# Patient Record
Sex: Female | Born: 1975
Health system: Southern US, Community
[De-identification: ages and names within clinical notes are randomized; demographics above are authoritative.]

## PROBLEM LIST (undated history)

## (undated) DIAGNOSIS — U071 COVID-19: Secondary | ICD-10-CM

## (undated) DIAGNOSIS — I10 Essential (primary) hypertension: Secondary | ICD-10-CM

## (undated) DIAGNOSIS — M1990 Unspecified osteoarthritis, unspecified site: Secondary | ICD-10-CM

## (undated) DIAGNOSIS — K219 Gastro-esophageal reflux disease without esophagitis: Secondary | ICD-10-CM

## (undated) DIAGNOSIS — Z720 Tobacco use: Secondary | ICD-10-CM

## (undated) HISTORY — DX: Unspecified osteoarthritis, unspecified site: M19.90

## (undated) HISTORY — PX: TONSILLECTOMY: SUR1361

## (undated) HISTORY — PX: TUBAL LIGATION: SHX77

---

## 2005-06-29 ENCOUNTER — Emergency Department: Payer: Self-pay | Admitting: Emergency Medicine

## 2005-12-11 ENCOUNTER — Emergency Department: Payer: Self-pay | Admitting: Internal Medicine

## 2006-10-14 ENCOUNTER — Emergency Department: Payer: Self-pay | Admitting: Emergency Medicine

## 2006-12-02 ENCOUNTER — Emergency Department: Payer: Self-pay

## 2007-09-29 ENCOUNTER — Ambulatory Visit: Payer: Self-pay | Admitting: Family Medicine

## 2007-10-07 ENCOUNTER — Ambulatory Visit: Payer: Self-pay | Admitting: Family Medicine

## 2008-01-14 ENCOUNTER — Emergency Department: Payer: Self-pay | Admitting: Emergency Medicine

## 2008-01-28 ENCOUNTER — Emergency Department: Payer: Self-pay | Admitting: Emergency Medicine

## 2008-02-08 ENCOUNTER — Emergency Department: Payer: Self-pay | Admitting: Emergency Medicine

## 2008-04-04 ENCOUNTER — Ambulatory Visit: Payer: Self-pay | Admitting: Certified Nurse Midwife

## 2008-10-16 ENCOUNTER — Emergency Department: Payer: Self-pay | Admitting: Emergency Medicine

## 2009-11-01 ENCOUNTER — Emergency Department: Payer: Self-pay | Admitting: Internal Medicine

## 2010-02-28 ENCOUNTER — Emergency Department: Payer: Self-pay | Admitting: Emergency Medicine

## 2010-09-12 ENCOUNTER — Emergency Department: Payer: Self-pay | Admitting: Internal Medicine

## 2011-05-26 ENCOUNTER — Emergency Department: Payer: Self-pay | Admitting: Emergency Medicine

## 2012-05-31 ENCOUNTER — Emergency Department: Payer: Self-pay | Admitting: Emergency Medicine

## 2012-05-31 LAB — URINALYSIS, COMPLETE
Bilirubin,UR: NEGATIVE
Glucose,UR: NEGATIVE mg/dL
Ketone: NEGATIVE
Nitrite: NEGATIVE
Ph: 5
Protein: 30
RBC,UR: 65 /HPF
Specific Gravity: 1.026
Squamous Epithelial: 20
WBC UR: 139 /HPF

## 2012-05-31 LAB — COMPREHENSIVE METABOLIC PANEL WITH GFR
Albumin: 3.5 g/dL
Alkaline Phosphatase: 60 U/L
Anion Gap: 6 — ABNORMAL LOW
BUN: 9 mg/dL
Bilirubin,Total: 0.4 mg/dL
Calcium, Total: 8.7 mg/dL
Chloride: 109 mmol/L — ABNORMAL HIGH
Co2: 26 mmol/L
Creatinine: 0.86 mg/dL
EGFR (African American): >60
EGFR (Non-African Amer.): >60
Glucose: 111 mg/dL — ABNORMAL HIGH
Osmolality: 281
Potassium: 3.7 mmol/L
SGOT(AST): 19 U/L
SGPT (ALT): 15 U/L
Sodium: 141 mmol/L
Total Protein: 7.5 g/dL

## 2012-05-31 LAB — CBC
HGB: 12.8 g/dL (ref 12.0–16.0)
MCH: 28.4 pg (ref 26.0–34.0)
MCV: 85 fL (ref 80–100)
Platelet: 308 10*3/uL (ref 150–440)
RBC: 4.52 10*6/uL (ref 3.80–5.20)
RDW: 14.8 % — ABNORMAL HIGH (ref 11.5–14.5)

## 2012-05-31 LAB — WET PREP, GENITAL

## 2012-05-31 LAB — PREGNANCY, URINE: Pregnancy Test, Urine: NEGATIVE m[IU]/mL

## 2012-06-01 LAB — URINE CULTURE

## 2012-07-21 ENCOUNTER — Emergency Department: Payer: Self-pay | Admitting: Emergency Medicine

## 2012-07-24 LAB — BETA STREP CULTURE(ARMC)

## 2014-04-25 ENCOUNTER — Emergency Department: Payer: Self-pay | Admitting: Emergency Medicine

## 2015-02-06 ENCOUNTER — Encounter: Payer: Self-pay | Admitting: Emergency Medicine

## 2015-02-06 ENCOUNTER — Emergency Department
Admission: EM | Admit: 2015-02-06 | Discharge: 2015-02-06 | Disposition: A | Discharge status: Home / Self Care | Payer: PRIVATE HEALTH INSURANCE | Attending: Emergency Medicine | Admitting: Emergency Medicine

## 2015-02-06 DIAGNOSIS — Z720 Tobacco use: Secondary | ICD-10-CM | POA: Insufficient documentation

## 2015-02-06 DIAGNOSIS — Y9289 Other specified places as the place of occurrence of the external cause: Secondary | ICD-10-CM | POA: Insufficient documentation

## 2015-02-06 DIAGNOSIS — X58XXXA Exposure to other specified factors, initial encounter: Secondary | ICD-10-CM | POA: Diagnosis not present

## 2015-02-06 DIAGNOSIS — Y998 Other external cause status: Secondary | ICD-10-CM | POA: Insufficient documentation

## 2015-02-06 DIAGNOSIS — S76012A Strain of muscle, fascia and tendon of left hip, initial encounter: Secondary | ICD-10-CM | POA: Insufficient documentation

## 2015-02-06 DIAGNOSIS — S79912A Unspecified injury of left hip, initial encounter: Secondary | ICD-10-CM | POA: Diagnosis present

## 2015-02-06 DIAGNOSIS — Y9389 Activity, other specified: Secondary | ICD-10-CM | POA: Diagnosis not present

## 2015-02-06 MED ORDER — CYCLOBENZAPRINE HCL 10 MG PO TABS: 10.0000 mg | ORAL_TABLET | Freq: Three times a day (TID) | ORAL | Status: DC | PRN

## 2015-02-06 MED ORDER — IBUPROFEN 800 MG PO TABS: 800.0000 mg | ORAL_TABLET | Freq: Three times a day (TID) | ORAL | Status: DC | PRN

## 2015-02-06 NOTE — ED Notes (Signed)
Pt to ed with c/o left upper leg and hip pain since Friday,  Pt states on Friday she was participating in intercourse when she heard a pop in left hip and then pain started.  Pt states pain is worse with movement.

## 2015-02-06 NOTE — ED Provider Notes (Signed)
[facility] Emergency Department Provider Note  ____________________________________________  Time seen: Approximately 9:28 AM  I have reviewed the triage vital signs and the nursing notes.   HISTORY  Chief Complaint Leg Pain    HPI Donna Odonnell is a 39 y.o. female presents the ER for complaints of left hip pain. Patient reports that 3 days ago she was having sexual intercourse and states that she had her legs in a "butterfly "position with her knees outwards. Patient states that when she opened her left leg she felt a pain in her left hip. Patient states that her pain is only present with movement. Denies pain when lying still or when standing. Patient states that pain is primarily with left hip bending or wheezing leg to the side. Patient states that current pain is 0 when lying still. Patient stated pain is 6 out of 10 with movement.  Denies abdominal pain, dysuria, vaginal pain, vaginal discharge, rectal pain, back pain, fall or injury. Denies other complaints.   History reviewed. No pertinent past medical history.  There are no active problems to display for this patient.   Past Surgical History  Procedure Laterality Date  . Cesarean section    . Tonsillectomy    . Tubal ligation      No current outpatient prescriptions on file.  Allergies Ciprofloxacin  Family History  Problem Relation Age of Onset  . Cancer Mother   . COPD Mother     Social History History  Substance Use Topics  . Smoking status: Current Every Day Smoker  . Smokeless tobacco: Not on file  . Alcohol Use: No    Review of Systems Constitutional: No fever/chills Eyes: No visual changes. ENT: No sore throat. Cardiovascular: Denies chest pain. Respiratory: Denies shortness of breath. Gastrointestinal: No abdominal pain.  No nausea, no vomiting.  No diarrhea.  No constipation. Genitourinary: Negative for dysuria. Musculoskeletal: Negative for back pain. Left hip  pain Skin: Negative for rash. Neurological: Negative for headaches, focal weakness or numbness.  10-point ROS otherwise negative.  ____________________________________________   PHYSICAL EXAM:  VITAL SIGNS: ED Triage Vitals  Enc Vitals Group     BP 02/06/15 0821 128/90 mmHg     Pulse Rate 02/06/15 0821 92     Resp 02/06/15 0821 18     Temp 02/06/15 0821 98.9 F (37.2 C)     Temp Source 02/06/15 0821 Oral     SpO2 02/06/15 0821 96 %     Weight 02/06/15 0821 235 lb (106.595 kg)     Height 02/06/15 0821  (1.651 m)     Head Cir --      Peak Flow --      Pain Score 02/06/15 0822 7     Pain Loc --      Pain Edu? --      Excl. in GC? --     Constitutional: Alert and oriented. Well appearing and in no acute distress. Eyes: Conjunctivae are normal. PERRL. EOMI. Head: Atraumatic.  Nose: No congestion/rhinnorhea.  Mouth/Throat: Mucous membranes are moist.  Oropharynx non-erythematous. Neck: No stridor.  No cervical spine tenderness to palpation. Hematological/Lymphatic/Immunilogical: No cervical lymphadenopathy. Cardiovascular: Normal rate, regular rhythm. Grossly normal heart sounds.  Good peripheral circulation. Respiratory: Normal respiratory effort.  No retractions. Lungs CTAB. Gastrointestinal: Soft and nontender. No distention. Normal Bowel sounds.  No abdominal bruits. No CVA tenderness. Musculoskeletal: No lower or upper extremity tenderness nor edema.  No joint effusions. Bilateral pedal pulses equal and easily  palpated.  Left lateral hip nontender. Left anterior hip at hip flexor mild to mod TTP, pain increases with left hip flexion and abduction. No pain when standing. Patient changes positions from lying to standing very quickly without distress or discomfort. No bony tenderness. Left leg nontender below left hip. Full range of motion present. Neurologic:  Normal speech and language. No gross focal neurologic deficits are appreciated. No gait instability. Skin:  Skin  is warm, dry and intact. No rash noted. Psychiatric: Mood and affect are normal. Speech and behavior are normal.  ____________________________________________   LABS (all labs ordered are listed, but only abnormal results are displayed)  Labs Reviewed - No data to display ___________________________________________   INITIAL IMPRESSION / ASSESSMENT AND PLAN / ED COURSE  Pertinent labs & imaging results that were available during my care of the patient were reviewed by me and considered in my medical decision making (see chart for details).  Very well-appearing patient. No acute distress. Presents the ER for complaints of left hip pain. Patient with left hip flexor tenderness and pain increasing with left hip flexion and abduction. No pain when lying still or when standing. Will treat patient strain injury with when necessary Flexeril and  ibuprofen. Discussed and demonstrated stretches as well as alternate heat and ice. Patient to follow up with primary care physician or orthopedic as needed for continued pain. Discussed follow-up and return parameters. Patient verbalized understanding and agreed to plan. ____________________________________________   FINAL CLINICAL IMPRESSION(S) / ED DIAGNOSES  Final diagnoses:  Strain of hip flexor, left, initial encounter       Renford Dills, NP 02/06/15 1610  Phineas Semen, MD 02/06/15 [phone]

## 2015-02-06 NOTE — Discharge Instructions (Signed)
Take medication as prescribed. Alternate heat and ice for comfort. Stretch well multiple times per day. Avoid overly strenuous activity.  Follow-up with your primary care physician or orthopedics as above as needed for continued pain. Return to the ER for new or worsening concerns.  Hip Pain Your hip is the joint between your upper legs and your lower pelvis. The bones, cartilage, tendons, and muscles of your hip joint perform a lot of work each day supporting your body weight and allowing you to move around. Hip pain can range from a minor ache to severe pain in one or both of your hips. Pain may be felt on the inside of the hip joint near the groin, or the outside near the buttocks and upper thigh. You may have swelling or stiffness as well.  HOME CARE INSTRUCTIONS   Take medicines only as directed by your health care provider.  Apply ice to the injured area:  Put ice in a plastic bag.  Place a towel between your skin and the bag.  Leave the ice on for 15-20 minutes at a time, 3-4 times a day.  Keep your leg raised (elevated) when possible to lessen swelling.  Avoid activities that cause pain.  Follow specific exercises as directed by your health care provider.  Sleep with a pillow between your legs on your most comfortable side.  Record how often you have hip pain, the location of the pain, and what it feels like. SEEK MEDICAL CARE IF:   You are unable to put weight on your leg.  Your hip is red or swollen or very tender to touch.  Your pain or swelling continues or worsens after 1 week.  You have increasing difficulty walking.  You have a fever. SEEK IMMEDIATE MEDICAL CARE IF:   You have fallen.  You have a sudden increase in pain and swelling in your hip. MAKE SURE YOU:   Understand these instructions.  Will watch your condition.  Will get help right away if you are not doing well or get worse. Document Released: 12/19/2009 Document Revised: 11/15/2013 Document  Reviewed: 02/25/2013 [facility] Patient Information 2015 [place], [place]. This information is not intended to replace advice given to you by your health care provider. Make sure you discuss any questions you have with your health care provider.    Strain A strain is an injury to a muscle or the tissue that connects muscles to bones (tendon). In a strain injury, the muscle or tendon is either stretched or torn. Muscles are more susceptible to strains if they cross two joints, such as:  Hamstrings.  Quadriceps.  Calves.  Biceps.  Hip flexor  There are three categories of strains:  A first-degree strain is a small tear in the muscle. There is no lengthening of the muscle, but pain may be present with contraction of the muscle.  A second-degree strain is a small tear in the muscle accompanied by lengthening of the muscle. Muscles with a second-degree strain are still able to function.  A third-degree strain is a complete tear of the muscle. Muscles with a third-degree strain cannot function properly. Strains often have bleeding and bruising within the muscle. SYMPTOMS   Pain, tenderness, redness or bruising, and swelling in the area of injury.  Loss of normal mobility of the injured joint. CAUSES  A sudden force exerted on a muscle or tendon that it cannot withstand usually causes strains. This may be due to a sudden overload of a contracted muscle, overuse, or  sudden increase or change in activity.  RISK INCREASES WITH:  Trauma.  Poor strength and flexibility.  Failure to warm-up properly before activity.  Return to activity before healing is complete. PREVENTION  Warm-up and stretch properly before and activity.  Maintain physical fitness:  Joint flexibility.  Muscle strength.  Endurance and conditioning.  Strengthen weak muscles with exercises to prevent recurrence. PROGNOSIS  If treated properly, strains are usually curable. The time it takes to recover is related  to the severity of the injury and usually varies from 2 to 8 weeks. RELATED COMPLICATIONS   Re-injury or recurrence of symptoms, permanent weakness.  Joint stiffness if the strain is severe and rehabilitation is incomplete.  Delayed healing or resolution of symptoms if sports are resumed before rehabilitation is complete.  Excessive bleeding into muscle, especially if taking anti-inflammatory medicines. This can lead to delayed recovery and injury to nerves, muscle, and blood vessels; this is an emergency. TREATMENT  Treatment initially involves ice and medicine to help reduce pain and inflammation. Use of the affected muscle should be limited by a:  Brace.  Elastic bandage wrapping.  Splint.  Cast.  Sling. Strengthening and stretching exercises may be necessary after immobilization to prevent joint stiffness. These exercises may be completed at home or with a therapist. If the tendon is torn, then surgery may be necessary to repair it.  MEDICATION   Avoid aspirin or ibuprofen in the first 48 hours after the injury. These medicines may increase the tendency to bleed. During this time, you may take pain relievers, such as acetaminophen, that do not affect bleeding.  After the first 48 hours, if pain medicine is necessary, then nonsteroidal anti-inflammatory medicines, such as aspirin and ibuprofen, or other minor pain relievers, such as acetaminophen, are often recommended.  Do not take pain medicine within 7 days before surgery.  Prescription pain relievers may be prescribed. Use only as directed and only as much as you need  Ointments applied to the skin may be helpful. HEAT AND COLD  Cold treatment (icing) relieves pain and reduces inflammation. Cold treatment should be applied for 10 to 15 minutes every 2 to 3 hours for inflammation and pain and immediately after any activity that aggravates your symptoms. Use ice packs or massage the area with a piece of ice (ice  massage).  Heat treatment may be used prior to performing the stretching and strengthening activities prescribed by your caregiver, physical therapist, or athletic trainer. Use a heat pack or soak your injury in warm water. SEEK MEDICAL CARE IF:   Symptoms get worse or do not improve despite treatment.  Pain becomes intolerable.  You experience numbness or tingling.  Toes or fingernails become cold or develop a blue, gray, or dusky color.  New, unexplained symptoms develop (drugs used in treatment may produce side effects). Document Released: 07/01/2005 Document Revised: 09/23/2011 Document Reviewed: 10/13/2008 [facility] Patient Information 2015 [place], [place]. This information is not intended to replace advice given to you by your health care provider. Make sure you discuss any questions you have with your health care provider.

## 2015-03-14 ENCOUNTER — Emergency Department
Admission: EM | Admit: 2015-03-14 | Discharge: 2015-03-14 | Disposition: A | Discharge status: Home / Self Care | Payer: Worker's Compensation | Attending: Emergency Medicine | Admitting: Emergency Medicine

## 2015-03-14 ENCOUNTER — Emergency Department: Payer: Worker's Compensation

## 2015-03-14 ENCOUNTER — Encounter: Payer: Self-pay | Admitting: Emergency Medicine

## 2015-03-14 DIAGNOSIS — S60351A Superficial foreign body of right thumb, initial encounter: Secondary | ICD-10-CM | POA: Insufficient documentation

## 2015-03-14 DIAGNOSIS — Y9389 Activity, other specified: Secondary | ICD-10-CM | POA: Insufficient documentation

## 2015-03-14 DIAGNOSIS — W458XXA Other foreign body or object entering through skin, initial encounter: Secondary | ICD-10-CM | POA: Insufficient documentation

## 2015-03-14 DIAGNOSIS — Z720 Tobacco use: Secondary | ICD-10-CM | POA: Insufficient documentation

## 2015-03-14 DIAGNOSIS — M795 Residual foreign body in soft tissue: Secondary | ICD-10-CM

## 2015-03-14 DIAGNOSIS — Y9289 Other specified places as the place of occurrence of the external cause: Secondary | ICD-10-CM | POA: Insufficient documentation

## 2015-03-14 DIAGNOSIS — Y990 Civilian activity done for income or pay: Secondary | ICD-10-CM | POA: Insufficient documentation

## 2015-03-14 MED ORDER — LIDOCAINE HCL (PF) 1 % IJ SOLN
INTRAMUSCULAR | Status: AC
  Administered 2015-03-14: 5 mL via INTRADERMAL
  Filled 2015-03-14: qty 5

## 2015-03-14 MED ORDER — CEPHALEXIN 500 MG PO CAPS
500.0000 mg | ORAL_CAPSULE | Freq: Once | ORAL | Status: AC
  Administered 2015-03-14: 500 mg via ORAL

## 2015-03-14 MED ORDER — OXYCODONE-ACETAMINOPHEN 5-325 MG PO TABS
ORAL_TABLET | ORAL | Status: AC
  Administered 2015-03-14: 1 via ORAL
  Filled 2015-03-14: qty 1

## 2015-03-14 MED ORDER — OXYCODONE-ACETAMINOPHEN 5-325 MG PO TABS
1.0000 | ORAL_TABLET | Freq: Once | ORAL | Status: AC
  Administered 2015-03-14: 1 via ORAL

## 2015-03-14 MED ORDER — LIDOCAINE HCL (PF) 1 % IJ SOLN
5.0000 mL | Freq: Once | INTRAMUSCULAR | Status: AC
  Administered 2015-03-14: 5 mL via INTRADERMAL

## 2015-03-14 MED ORDER — CEPHALEXIN 500 MG PO CAPS
ORAL_CAPSULE | ORAL | Status: AC
  Administered 2015-03-14: 500 mg via ORAL
  Filled 2015-03-14: qty 1

## 2015-03-14 MED ORDER — CEPHALEXIN 500 MG PO CAPS: 500.0000 mg | ORAL_CAPSULE | Freq: Two times a day (BID) | ORAL | Status: DC

## 2015-03-14 NOTE — ED Notes (Addendum)
Patient ambulatory to triage with steady gait, without difficulty or distress noted; pt with fabric hook embedded to right thumb; employeed with [employer], [place] (workers comp profile indicates UDS and breath analysis required)

## 2015-03-14 NOTE — ED Notes (Signed)

## 2015-03-14 NOTE — ED Notes (Signed)
WC completed and delivered to lab for courier p/u. 

## 2015-03-14 NOTE — ED Provider Notes (Signed)
[facility] Emergency Department Provider Note  ____________________________________________  Time seen: 5:00 AM  I have reviewed the triage vital signs and the nursing notes.   HISTORY  Chief Complaint Foreign Body     HPI Donna Odonnell is a 39 y.o. female presents with a fabric Oak embedded in her right thumb. Patient was at work at [employer] this occurred.     Past medical history None There are no active problems to display for this patient.   Past Surgical History  Procedure Laterality Date  . Cesarean section    . Tonsillectomy    . Tubal ligation      Current Outpatient Rx  Name  Route  Sig  Dispense  Refill  . cyclobenzaprine (FLEXERIL) 10 MG tablet   Oral   Take 1 tablet (10 mg total) by mouth every 8 (eight) hours as needed for muscle spasms (PRN pain. Do not drive or operate heavy machinery while taking as can cause drowsiness.).   12 tablet   0   . ibuprofen (ADVIL,MOTRIN) 800 MG tablet   Oral   Take 1 tablet (800 mg total) by mouth every 8 (eight) hours as needed for mild pain or moderate pain.   15 tablet   0     Allergies Ciprofloxacin  Family History  Problem Relation Age of Onset  . Cancer Mother   . COPD Mother     Social History Social History  Substance Use Topics  . Smoking status: Current Every Day Smoker  . Smokeless tobacco: None  . Alcohol Use: No    Review of Systems  Constitutional: Negative for fever. Eyes: Negative for visual changes. ENT: Negative for sore throat. Cardiovascular: Negative for chest pain. Respiratory: Negative for shortness of breath. Gastrointestinal: Negative for abdominal pain, vomiting and diarrhea. Genitourinary: Negative for dysuria. Musculoskeletal: Negative for back pain. Skin: Negative for rash. Positive for needle in right thumb Neurological: Negative for headaches, focal weakness or numbness.   10-point ROS otherwise  negative.  ____________________________________________   PHYSICAL EXAM:  VITAL SIGNS: ED Triage Vitals  Enc Vitals Group     BP 03/14/15 0224 126/88 mmHg     Pulse Rate 03/14/15 0224 94     Resp 03/14/15 0224 18     Temp 03/14/15 0224 98.4 F (36.9 C)     Temp Source 03/14/15 0224 Oral     SpO2 03/14/15 0224 96 %     Weight 03/14/15 0224 237 lb (107.502 kg)     Height 03/14/15 0224 5\' 5"  (1.651 m)     Head Cir --      Peak Flow --      Pain Score --      Pain Loc --      Pain Edu? --      Excl. in GC? --      Constitutional: Alert and oriented. Well appearing and in no distress. Eyes: Conjunctivae are normal. PERRL. Normal extraocular movements. Musculoskeletal: Nontender with normal range of motion in all extremities. No joint effusions.  No lower extremity tenderness nor edema. Neurologic:  Normal speech and language. No gross focal neurologic deficits are appreciated. Speech is normal.  Skin:  Fabric needle embedded in nail bed of right thumb    RADIOLOGY Results       DG Finger Thumb Right (Final result) Result time: 03/14/15 04:33:39   Final result by Rad Results In Interface (03/14/15 04:33:39)   Narrative:   CLINICAL DATA: Puncture wound  EXAM:  RIGHT THUMB 2+V  COMPARISON: None.  FINDINGS: There is a metallic foreign body extending into the dorsal aspect of the first distal phalanx at the level of the nail bed. The foreign body has a curved slipped at its distal tip. It is intact, with no additional fragments. The bone appears intact. There is no bony fracture.  IMPRESSION: Foreign body imbedded in the soft tissues at the dorsal aspect of the distal phalanx.   Electronically Signed By: Ellery Plunk M.D. On: 03/14/2015 04:33    Procedure note: After reviewing patient's x-ray Patient's right thumb was cleaned with Betadine. Approximately 2 ML's of 1% lidocaine was introduced adjacent to foreign body. Fabric needle was removed  with gentle traction. No active bleeding noted following removal.   INITIAL IMPRESSION / ASSESSMENT AND PLAN / ED COURSE  Pertinent labs & imaging results that were available during my care of the patient were reviewed by me and considered in my medical decision making (see chart for details).    ____________________________________________   FINAL CLINICAL IMPRESSION(S) / ED DIAGNOSES  Final diagnoses:  Foreign body of right thumb  Foreign body (FB) in soft tissue      Darci Current, MD 03/17/15 [phone]

## 2015-09-19 ENCOUNTER — Emergency Department: Payer: Worker's Compensation

## 2015-09-19 ENCOUNTER — Emergency Department
Admission: EM | Admit: 2015-09-19 | Discharge: 2015-09-19 | Disposition: A | Discharge status: Home / Self Care | Payer: Worker's Compensation | Attending: Emergency Medicine | Admitting: Emergency Medicine

## 2015-09-19 ENCOUNTER — Encounter: Payer: Self-pay | Admitting: Medical Oncology

## 2015-09-19 DIAGNOSIS — W273XXA Contact with needle (sewing), initial encounter: Secondary | ICD-10-CM | POA: Diagnosis not present

## 2015-09-19 DIAGNOSIS — Y9289 Other specified places as the place of occurrence of the external cause: Secondary | ICD-10-CM | POA: Diagnosis not present

## 2015-09-19 DIAGNOSIS — Y990 Civilian activity done for income or pay: Secondary | ICD-10-CM | POA: Insufficient documentation

## 2015-09-19 DIAGNOSIS — S60352A Superficial foreign body of left thumb, initial encounter: Secondary | ICD-10-CM | POA: Insufficient documentation

## 2015-09-19 DIAGNOSIS — S6992XA Unspecified injury of left wrist, hand and finger(s), initial encounter: Secondary | ICD-10-CM | POA: Diagnosis present

## 2015-09-19 DIAGNOSIS — Z792 Long term (current) use of antibiotics: Secondary | ICD-10-CM | POA: Diagnosis not present

## 2015-09-19 DIAGNOSIS — Y9389 Activity, other specified: Secondary | ICD-10-CM | POA: Insufficient documentation

## 2015-09-19 DIAGNOSIS — F17200 Nicotine dependence, unspecified, uncomplicated: Secondary | ICD-10-CM | POA: Diagnosis not present

## 2015-09-19 MED ORDER — LIDOCAINE HCL (PF) 1 % IJ SOLN
INTRAMUSCULAR | Status: AC
  Filled 2015-09-19: qty 5

## 2015-09-19 MED ORDER — TRAMADOL HCL 50 MG PO TABS: 50.0000 mg | ORAL_TABLET | Freq: Four times a day (QID) | ORAL | Status: DC | PRN

## 2015-09-19 NOTE — ED Notes (Signed)
See triage note. Pt has a knitting needle stuck in left thumb.

## 2015-09-19 NOTE — ED Notes (Signed)
Pt was at work and had injury to left thumb, there is a knitting hook in thumb. Tetanus status unk.

## 2015-09-19 NOTE — ED Provider Notes (Signed)
[facility] Emergency Department Provider Note  ____________________________________________  Time seen: Approximately 7:57 AM  I have reviewed the triage vital signs and the nursing notes.   HISTORY  Chief Complaint Finger Injury    HPI Donna Odonnell is a 40 y.o. female patient complaining of foreign body left thumb. Patient states she has a knitting needle stuck in her left thumb. Incident occurred at work. Patient says tetanus status unknown. Patient rates the pain as a 5/10. Patient described a pain as sharp. No palliative measures taken prior to arrival.   History reviewed. No pertinent past medical history.  There are no active problems to display for this patient.   Past Surgical History  Procedure Laterality Date  . Cesarean section    . Tonsillectomy    . Tubal ligation      Current Outpatient Rx  Name  Route  Sig  Dispense  Refill  . cephALEXin (KEFLEX) 500 MG capsule   Oral   Take 1 capsule (500 mg total) by mouth 2 (two) times daily.   14 capsule   0   . cyclobenzaprine (FLEXERIL) 10 MG tablet   Oral   Take 1 tablet (10 mg total) by mouth every 8 (eight) hours as needed for muscle spasms (PRN pain. Do not drive or operate heavy machinery while taking as can cause drowsiness.).   12 tablet   0   . ibuprofen (ADVIL,MOTRIN) 800 MG tablet   Oral   Take 1 tablet (800 mg total) by mouth every 8 (eight) hours as needed for mild pain or moderate pain.   15 tablet   0   . traMADol (ULTRAM) 50 MG tablet   Oral   Take 1 tablet (50 mg total) by mouth every 6 (six) hours as needed.   20 tablet   0     Allergies Ciprofloxacin  Family History  Problem Relation Age of Onset  . Cancer Mother   . COPD Mother     Social History Social History  Substance Use Topics  . Smoking status: Current Every Day Smoker  . Smokeless tobacco: None  . Alcohol Use: No    Review of Systems Constitutional: No fever/chills Eyes: No visual  changes. ENT: No sore throat. Cardiovascular: Denies chest pain. Respiratory: Denies shortness of breath. Gastrointestinal: No abdominal pain.  No nausea, no vomiting.  No diarrhea.  No constipation. Genitourinary: Negative for dysuria. Musculoskeletal: Negative for back pain. Skin: Negative for rash. Foreign body left thumb Neurological: Negative for headaches, focal weakness or numbness.    ____________________________________________   PHYSICAL EXAM:  VITAL SIGNS: ED Triage Vitals  Enc Vitals Group     BP 09/19/15 0728 135/83 mmHg     Pulse Rate 09/19/15 0728 80     Resp 09/19/15 0728 18     Temp 09/19/15 0728 98.3 F (36.8 C)     Temp Source 09/19/15 0728 Oral     SpO2 09/19/15 0728 99 %     Weight 09/19/15 0728 245 lb (111.131 kg)     Height 09/19/15 0728  (1.651 m)     Head Cir --      Peak Flow --      Pain Score 09/19/15 0729 5     Pain Loc --      Pain Edu? --      Excl. in GC? --     Constitutional: Alert and oriented. Well appearing and in no acute distress. Eyes: Conjunctivae are normal. PERRL.  EOMI. Head: Atraumatic. Nose: No congestion/rhinnorhea. Mouth/Throat: Mucous membranes are moist.  Oropharynx non-erythematous. Neck: No stridor.  No cervical spine tenderness to palpation. Hematological/Lymphatic/Immunilogical: No cervical lymphadenopathy. Cardiovascular: Normal rate, regular rhythm. Grossly normal heart sounds.  Good peripheral circulation. Respiratory: Normal respiratory effort.  No retractions. Lungs CTAB. Gastrointestinal: Soft and nontender. No distention. No abdominal bruits. No CVA tenderness. Musculoskeletal: No lower extremity tenderness nor edema.  No joint effusions. Neurologic:  Normal speech and language. No gross focal neurologic deficits are appreciated. No gait instability. Skin:  Skin is warm, dry and intact. No rash noted. Foreign body right thumb Psychiatric: Mood and affect are normal. Speech and behavior are  normal.  ____________________________________________   LABS (all labs ordered are listed, but only abnormal results are displayed)  Labs Reviewed - No data to display ____________________________________________  EKG   ____________________________________________  RADIOLOGY   ____________________________________________   PROCEDURES  Procedure(s) performed: None  Critical Care performed: No  ____________________________________________   INITIAL IMPRESSION / ASSESSMENT AND PLAN / ED COURSE  Pertinent labs & imaging results that were available during my care of the patient were reviewed by me and considered in my medical decision making (see chart for details).  Foreign body left thumb removed after digital block. Patient given a tetanus shot ED. Patient given discharge care instructions and a prescription for tramadol to take as needed for pain. Advised return by ER for condition worsens. ____________________________________________   FINAL CLINICAL IMPRESSION(S) / ED DIAGNOSES  Final diagnoses:  Foreign body of left thumb      Joni ReiningRonald K Keziah Avis, PA-C 09/19/15 [phone]  Emily FilbertJonathan E Williams, MD 09/19/15 [phone]

## 2015-09-20 ENCOUNTER — Other Ambulatory Visit: Payer: Self-pay | Admitting: Internal Medicine

## 2015-09-20 ENCOUNTER — Ambulatory Visit
Admission: RE | Admit: 2015-09-20 | Discharge: 2015-09-20 | Disposition: A | Discharge status: Home / Self Care | Payer: PRIVATE HEALTH INSURANCE | Source: Ambulatory Visit | Attending: Internal Medicine | Admitting: Internal Medicine

## 2015-09-20 DIAGNOSIS — R1032 Left lower quadrant pain: Secondary | ICD-10-CM | POA: Diagnosis present

## 2016-04-24 ENCOUNTER — Other Ambulatory Visit: Payer: Self-pay | Admitting: Internal Medicine

## 2016-04-24 DIAGNOSIS — Z1231 Encounter for screening mammogram for malignant neoplasm of breast: Secondary | ICD-10-CM

## 2016-04-26 ENCOUNTER — Ambulatory Visit
Admission: RE | Admit: 2016-04-26 | Discharge: 2016-04-26 | Disposition: A | Discharge status: Home / Self Care | Payer: PRIVATE HEALTH INSURANCE | Source: Ambulatory Visit | Attending: Internal Medicine | Admitting: Internal Medicine

## 2016-04-26 DIAGNOSIS — Z1231 Encounter for screening mammogram for malignant neoplasm of breast: Secondary | ICD-10-CM | POA: Diagnosis not present

## 2016-05-12 ENCOUNTER — Encounter: Payer: Self-pay | Admitting: Radiology

## 2016-05-12 ENCOUNTER — Emergency Department
Admission: EM | Admit: 2016-05-12 | Discharge: 2016-05-12 | Disposition: A | Discharge status: Home / Self Care | Payer: PRIVATE HEALTH INSURANCE | Attending: Student in an Organized Health Care Education/Training Program | Admitting: Student in an Organized Health Care Education/Training Program

## 2016-05-12 ENCOUNTER — Emergency Department: Payer: PRIVATE HEALTH INSURANCE

## 2016-05-12 DIAGNOSIS — I159 Secondary hypertension, unspecified: Secondary | ICD-10-CM | POA: Diagnosis not present

## 2016-05-12 DIAGNOSIS — R519 Headache, unspecified: Secondary | ICD-10-CM

## 2016-05-12 DIAGNOSIS — R51 Headache: Secondary | ICD-10-CM | POA: Diagnosis present

## 2016-05-12 DIAGNOSIS — F17200 Nicotine dependence, unspecified, uncomplicated: Secondary | ICD-10-CM | POA: Insufficient documentation

## 2016-05-12 LAB — URINALYSIS COMPLETE WITH MICROSCOPIC (ARMC ONLY)
Bilirubin Urine: NEGATIVE
GLUCOSE, UA: NEGATIVE mg/dL
Ketones, ur: NEGATIVE mg/dL
Nitrite: NEGATIVE
Protein, ur: NEGATIVE mg/dL
SPECIFIC GRAVITY, URINE: 1.012 (ref 1.005–1.030)
pH: 5 (ref 5.0–8.0)

## 2016-05-12 LAB — COMPREHENSIVE METABOLIC PANEL
ALBUMIN: 3.6 g/dL (ref 3.5–5.0)
ALK PHOS: 44 U/L (ref 38–126)
ALT: 11 U/L — ABNORMAL LOW (ref 14–54)
ANION GAP: 5 (ref 5–15)
AST: 15 U/L (ref 15–41)
BILIRUBIN TOTAL: 0.4 mg/dL (ref 0.3–1.2)
BUN: 8 mg/dL (ref 6–20)
CALCIUM: 8.4 mg/dL — AB (ref 8.9–10.3)
CO2: 25 mmol/L (ref 22–32)
CREATININE: 0.77 mg/dL (ref 0.44–1.00)
Chloride: 110 mmol/L (ref 101–111)
GFR calc Af Amer: >60 mL/min (ref >60)
GFR calc non Af Amer: >60 mL/min (ref >60)
GLUCOSE: 105 mg/dL — AB (ref 65–99)
Potassium: 3.4 mmol/L — ABNORMAL LOW (ref 3.5–5.1)
Sodium: 140 mmol/L (ref 135–145)
TOTAL PROTEIN: 7 g/dL (ref 6.5–8.1)

## 2016-05-12 LAB — CBC
HEMATOCRIT: 35.4 % (ref 35.0–47.0)
HEMOGLOBIN: 12.2 g/dL (ref 12.0–16.0)
MCH: 28.2 pg (ref 26.0–34.0)
MCHC: 34.3 g/dL (ref 32.0–36.0)
MCV: 82.2 fL (ref 80.0–100.0)
Platelets: 271 10*3/uL (ref 150–440)
RBC: 4.31 MIL/uL (ref 3.80–5.20)
RDW: 17.2 % — ABNORMAL HIGH (ref 11.5–14.5)
WBC: 7.3 10*3/uL (ref 3.6–11.0)

## 2016-05-12 LAB — FIBRIN DERIVATIVES D-DIMER (ARMC ONLY): FIBRIN DERIVATIVES D-DIMER (ARMC): 703 — AB (ref 0–499)

## 2016-05-12 LAB — POCT PREGNANCY, URINE: PREG TEST UR: NEGATIVE

## 2016-05-12 MED ORDER — T.E.D. BELOW KNEE/MEDIUM MISC: 1.0000 "application " | Freq: Every morning | 0 refills | Status: DC

## 2016-05-12 MED ORDER — DIPHENHYDRAMINE HCL 50 MG/ML IJ SOLN
25.0000 mg | Freq: Once | INTRAMUSCULAR | Status: DC
  Filled 2016-05-12: qty 1

## 2016-05-12 MED ORDER — PROCHLORPERAZINE EDISYLATE 5 MG/ML IJ SOLN
10.0000 mg | Freq: Once | INTRAMUSCULAR | Status: DC
  Filled 2016-05-12: qty 2

## 2016-05-12 MED ORDER — NAPROXEN SODIUM 275 MG PO TABS: 275.0000 mg | ORAL_TABLET | Freq: Two times a day (BID) | ORAL | 0 refills | Status: AC

## 2016-05-12 MED ORDER — CYCLOBENZAPRINE HCL 10 MG PO TABS: 10.0000 mg | ORAL_TABLET | Freq: Every day | ORAL | 0 refills | Status: DC

## 2016-05-12 MED ORDER — IOPAMIDOL (ISOVUE-300) INJECTION 61%
75.0000 mL | Freq: Once | INTRAVENOUS | Status: AC | PRN
  Administered 2016-05-12: 75 mL via INTRAVENOUS

## 2016-05-12 MED ORDER — ACETAMINOPHEN 500 MG PO TABS
1000.0000 mg | ORAL_TABLET | Freq: Once | ORAL | Status: AC
  Administered 2016-05-12: 1000 mg via ORAL
  Filled 2016-05-12: qty 2

## 2016-05-12 NOTE — ED Provider Notes (Signed)
[facility] Emergency Department Provider Note    First MD Initiated Contact with Patient 05/12/16 1231     (approximate)  I have reviewed the triage vital signs and the nursing notes.   HISTORY  Chief Complaint Headache    HPI Donna Odonnell is a 40 y.o. female with no previous history of recurrent headaches presents with 3 days of right-sided headache associated with blurry vision. States the headache is been fairly persistent roughly an 8 out of 10 in severity. Does not recall having a headache this bad. There is no rapid onset. States is gradually worsening over 3 days. Has had nausea but no vomiting. No numbness or tingling. No recent surgeries. No head trauma. States that she thought was from her elevated blood pressure. States she also noted swelling in her legs which is more than usual. Denies any shortness of breath or chest pain.   No history of blood clots  There are no active problems to display for this patient.   Past Surgical History:  Procedure Laterality Date  . CESAREAN SECTION    . TONSILLECTOMY    . TUBAL LIGATION      Prior to Admission medications   Medication Sig Start Date End Date Taking? Authorizing Provider  cephALEXin (KEFLEX) 500 MG capsule Take 1 capsule (500 mg total) by mouth 2 (two) times daily. 03/14/15   Darci Currentandolph N Brown, MD  cyclobenzaprine (FLEXERIL) 10 MG tablet Take 1 tablet (10 mg total) by mouth every 8 (eight) hours as needed for muscle spasms (PRN pain. Do not drive or operate heavy machinery while taking as can cause drowsiness.). 02/06/15   Renford DillsLindsey Miller, NP  ibuprofen (ADVIL,MOTRIN) 800 MG tablet Take 1 tablet (800 mg total) by mouth every 8 (eight) hours as needed for mild pain or moderate pain. 02/06/15   Renford DillsLindsey Miller, NP  traMADol (ULTRAM) 50 MG tablet Take 1 tablet (50 mg total) by mouth every 6 (six) hours as needed. 09/19/15 09/18/16  Joni Reiningonald K Smith, PA-C    Allergies Ciprofloxacin  Family History    Problem Relation Age of Onset  . Cancer Mother   . COPD Mother   . Breast cancer Mother 7550  . Breast cancer Maternal Grandmother 6160    Social History Social History  Substance Use Topics  . Smoking status: Current Every Day Smoker  . Smokeless tobacco: Not on file  . Alcohol use No    Review of Systems Patient denies headaches, rhinorrhea, blurry vision, numbness, shortness of breath, chest pain, edema, cough, abdominal pain, nausea, vomiting, diarrhea, dysuria, fevers, rashes or hallucinations unless otherwise stated above in HPI. ____________________________________________   PHYSICAL EXAM:  VITAL SIGNS: Vitals:   05/12/16 1147  BP: (!) 158/88  Pulse: 79  Resp: 18  Temp: 98.7 F (37.1 C)    Constitutional: Alert and oriented. Well appearing and in no acute distress. Eyes: Conjunctivae are normal. PERRL. EOMI. Head: Atraumatic. Nose: No congestion/rhinnorhea. Mouth/Throat: Mucous membranes are moist.  Oropharynx non-erythematous. Neck: No stridor. Painless ROM. No cervical spine tenderness to palpation Hematological/Lymphatic/Immunilogical: No cervical lymphadenopathy. Cardiovascular: Normal rate, regular rhythm. Grossly normal heart sounds.  Good peripheral circulation. Respiratory: Normal respiratory effort.  No retractions. Lungs CTAB. Gastrointestinal: Soft and nontender. No distention. No abdominal bruits. No CVA tenderness. Genitourinary:  Musculoskeletal: No lower extremity tenderness nor edema.  No joint effusions. Neurologic:  CN- intact.  No facial droop, Normal FNF.  Normal heel to shin.  Sensation intact bilaterally. Normal speech and language. No  gross focal neurologic deficits are appreciated. No gait instability.  Skin:  Skin is warm, dry and intact. No rash noted. Psychiatric: Mood and affect are normal. Speech and behavior are normal.  ____________________________________________   LABS (all labs ordered are listed, but only abnormal results are  displayed)  Results for orders placed or performed during the hospital encounter of 05/12/16 (from the past 24 hour(s))  CBC     Status: Abnormal   Collection Time: 05/12/16 11:59 AM  Result Value Ref Range   WBC 7.3 3.6 - 11.0 K/uL   RBC 4.31 3.80 - 5.20 MIL/uL   Hemoglobin 12.2 12.0 - 16.0 g/dL   HCT 82.935.4 56.235.0 - 13.047.0 %   MCV 82.2 80.0 - 100.0 fL   MCH 28.2 26.0 - 34.0 pg   MCHC 34.3 32.0 - 36.0 g/dL   RDW 86.517.2 (H) 78.411.5 - 69.614.5 %   Platelets 271 150 - 440 K/uL  Urinalysis complete, with microscopic ([facility] only)     Status: Abnormal   Collection Time: 05/12/16 11:59 AM  Result Value Ref Range   Color, Urine YELLOW (A) YELLOW   APPearance HAZY (A) CLEAR   Glucose, UA NEGATIVE NEGATIVE mg/dL   Bilirubin Urine NEGATIVE NEGATIVE   Ketones, ur NEGATIVE NEGATIVE mg/dL   Specific Gravity, Urine 1.012 1.005 - 1.030   Hgb urine dipstick 2+ (A) NEGATIVE   pH 5.0 5.0 - 8.0   Protein, ur NEGATIVE NEGATIVE mg/dL   Nitrite NEGATIVE NEGATIVE   Leukocytes, UA 3+ (A) NEGATIVE   RBC / HPF 0-5 0 - 5 RBC/hpf   WBC, UA TOO NUMEROUS TO COUNT 0 - 5 WBC/hpf   Bacteria, UA MANY (A) NONE SEEN   Squamous Epithelial / LPF 0-5 (A) NONE SEEN   WBC Clumps PRESENT    Mucous PRESENT   Pregnancy, urine POC     Status: None   Collection Time: 05/12/16 12:10 PM  Result Value Ref Range   Preg Test, Ur NEGATIVE NEGATIVE   ____________________________________________  EKG____________________________________________  RADIOLOGY  I personally reviewed all radiographic images ordered to evaluate for the above acute complaints and reviewed radiology reports and findings.  These findings were personally discussed with the patient.  Please see medical record for radiology report. ____________________________________________   PROCEDURES  Procedure(s) performed: none    Critical Care performed: no ____________________________________________   INITIAL IMPRESSION / ASSESSMENT AND PLAN / ED  COURSE  Pertinent labs & imaging results that were available during my care of the patient were reviewed by me and considered in my medical decision making (see chart for details).  DDX: migraine, tension, cvt, htn urgency, uti  Natisha S Brooke Odonnell is a 40 y.o. who presents to the ED with 3 days of headache. Patient arrives afebrile and hemodynamic stable. She is well-appearing but her description of pain is particularly atypical for new migraine. She did not describe any aura and has described persistent blurry vision. Does not have any diplopia. Pupils are reactive bilaterally without any consensual photophobia. Not consistent with glaucoma. Based on her symptoms and blurry vision with dull headache CT is in the differential and we'll further risk stratify with d-dimer. If positive will order CT venogram.  The patient will be placed on continuous pulse oximetry and telemetry for monitoring.  Laboratory evaluation will be sent to evaluate for the above complaints.     Clinical Course  Comment By Time  D-dimer is elevated and based on her description of symptoms do feel further evaluation with CT  venography as well as ultrasound of the lower extremities will be ordered. Patient currently asymptomatic but still describing some blurry vision.  She does not have any visual field cuts.  Will continue to monitor. Willy Eddy, MD 10/29 1455  Patient's headache has continued to be resolved. She denies any blurry vision. Discussed results of CT imaging as well as lower extremity duplex. No evidence of DVT or CVT. Is not clinically consistent with PE. Patient is irregular with steady gait and repeat neuro exam is reassuring. Not clinically consistent with acute infectious process. Patient asymptomatic and able to ambulate with a steady gait and tolerate oral hydration feel patient is appropriate for outpatient follow-up.  Have discussed with the patient and available family all diagnostics and treatments  performed thus far and all questions were answered to the best of my ability. The patient demonstrates understanding and agreement with plan.  Willy Eddy, MD 10/29 1721     ____________________________________________   FINAL CLINICAL IMPRESSION(S) / ED DIAGNOSES  Final diagnoses:  Headache  Nonintractable headache, unspecified chronicity pattern, unspecified headache type  Secondary hypertension      NEW MEDICATIONS STARTED DURING THIS VISIT:  New Prescriptions   No medications on file     Note:  This document was prepared using Dragon voice recognition software and may include unintentional dictation errors.    Willy Eddy, MD 05/12/16 [id number]

## 2016-05-12 NOTE — ED Triage Notes (Signed)
FIRST NURSE NOTE:  AAOx3.  Skin warm and dry.  C/O headache x 3 days.  Blurred vision x 1 day.  Moving all extremities equally and strong.  Gait steady and easy. Posture upright and relaxed.  NAD.

## 2016-05-12 NOTE — ED Triage Notes (Addendum)
Pt denies history of migraines states pain is located on both side of her head also c/o swelling in right ankle and bilateral hands  denies injury

## 2016-05-12 NOTE — Discharge Instructions (Signed)

## 2017-02-05 ENCOUNTER — Other Ambulatory Visit: Payer: Self-pay | Admitting: Internal Medicine

## 2017-02-05 DIAGNOSIS — Z1231 Encounter for screening mammogram for malignant neoplasm of breast: Secondary | ICD-10-CM

## 2017-05-16 ENCOUNTER — Ambulatory Visit
Admission: RE | Admit: 2017-05-16 | Discharge: 2017-05-16 | Disposition: A | Discharge status: Home / Self Care | Payer: BLUE CROSS/BLUE SHIELD | Source: Ambulatory Visit | Attending: Internal Medicine | Admitting: Internal Medicine

## 2017-05-16 DIAGNOSIS — Z1231 Encounter for screening mammogram for malignant neoplasm of breast: Secondary | ICD-10-CM | POA: Insufficient documentation

## 2017-05-23 ENCOUNTER — Other Ambulatory Visit: Payer: Self-pay | Admitting: Internal Medicine

## 2017-05-23 DIAGNOSIS — R928 Other abnormal and inconclusive findings on diagnostic imaging of breast: Secondary | ICD-10-CM

## 2017-05-23 DIAGNOSIS — N6489 Other specified disorders of breast: Secondary | ICD-10-CM

## 2017-05-29 ENCOUNTER — Ambulatory Visit
Admission: RE | Admit: 2017-05-29 | Discharge: 2017-05-29 | Disposition: A | Discharge status: Home / Self Care | Payer: BLUE CROSS/BLUE SHIELD | Source: Ambulatory Visit | Attending: Internal Medicine | Admitting: Internal Medicine

## 2017-05-29 DIAGNOSIS — R928 Other abnormal and inconclusive findings on diagnostic imaging of breast: Secondary | ICD-10-CM | POA: Insufficient documentation

## 2017-05-29 DIAGNOSIS — N6489 Other specified disorders of breast: Secondary | ICD-10-CM

## 2017-05-29 DIAGNOSIS — N6001 Solitary cyst of right breast: Secondary | ICD-10-CM | POA: Diagnosis not present

## 2017-05-31 ENCOUNTER — Other Ambulatory Visit: Payer: Self-pay

## 2017-05-31 ENCOUNTER — Emergency Department
Admission: EM | Admit: 2017-05-31 | Discharge: 2017-05-31 | Disposition: A | Discharge status: Home / Self Care | Payer: BLUE CROSS/BLUE SHIELD | Attending: Emergency Medicine | Admitting: Emergency Medicine

## 2017-05-31 ENCOUNTER — Emergency Department: Payer: BLUE CROSS/BLUE SHIELD

## 2017-05-31 ENCOUNTER — Encounter: Payer: Self-pay | Admitting: Emergency Medicine

## 2017-05-31 DIAGNOSIS — Z79899 Other long term (current) drug therapy: Secondary | ICD-10-CM | POA: Diagnosis not present

## 2017-05-31 DIAGNOSIS — R1013 Epigastric pain: Secondary | ICD-10-CM | POA: Diagnosis present

## 2017-05-31 DIAGNOSIS — F17210 Nicotine dependence, cigarettes, uncomplicated: Secondary | ICD-10-CM | POA: Insufficient documentation

## 2017-05-31 DIAGNOSIS — R1011 Right upper quadrant pain: Secondary | ICD-10-CM | POA: Diagnosis not present

## 2017-05-31 DIAGNOSIS — K2970 Gastritis, unspecified, without bleeding: Secondary | ICD-10-CM | POA: Diagnosis not present

## 2017-05-31 LAB — COMPREHENSIVE METABOLIC PANEL
ALBUMIN: 3.9 g/dL (ref 3.5–5.0)
ALK PHOS: 47 U/L (ref 38–126)
ALT: 12 U/L — AB (ref 14–54)
AST: 21 U/L (ref 15–41)
Anion gap: 8 (ref 5–15)
BUN: 12 mg/dL (ref 6–20)
CALCIUM: 8.6 mg/dL — AB (ref 8.9–10.3)
CO2: 25 mmol/L (ref 22–32)
CREATININE: 0.83 mg/dL (ref 0.44–1.00)
Chloride: 107 mmol/L (ref 101–111)
GFR calc Af Amer: >60 mL/min (ref >60)
GFR calc non Af Amer: >60 mL/min (ref >60)
GLUCOSE: 109 mg/dL — AB (ref 65–99)
Potassium: 3.4 mmol/L — ABNORMAL LOW (ref 3.5–5.1)
SODIUM: 140 mmol/L (ref 135–145)
Total Bilirubin: 0.7 mg/dL (ref 0.3–1.2)
Total Protein: 7.3 g/dL (ref 6.5–8.1)

## 2017-05-31 LAB — CBC
HCT: 39.4 % (ref 35.0–47.0)
Hemoglobin: 12.8 g/dL (ref 12.0–16.0)
MCH: 27.9 pg (ref 26.0–34.0)
MCHC: 32.6 g/dL (ref 32.0–36.0)
MCV: 85.6 fL (ref 80.0–100.0)
PLATELETS: 258 10*3/uL (ref 150–440)
RBC: 4.61 MIL/uL (ref 3.80–5.20)
RDW: 15.5 % — ABNORMAL HIGH (ref 11.5–14.5)
WBC: 5.9 10*3/uL (ref 3.6–11.0)

## 2017-05-31 LAB — URINALYSIS, COMPLETE (UACMP) WITH MICROSCOPIC
Bilirubin Urine: NEGATIVE
GLUCOSE, UA: NEGATIVE mg/dL
Ketones, ur: NEGATIVE mg/dL
Nitrite: NEGATIVE
PH: 7 (ref 5.0–8.0)
PROTEIN: 30 mg/dL — AB
Specific Gravity, Urine: 1.018 (ref 1.005–1.030)

## 2017-05-31 LAB — POCT PREGNANCY, URINE: Preg Test, Ur: NEGATIVE

## 2017-05-31 LAB — LIPASE, BLOOD: Lipase: 22 U/L (ref 11–51)

## 2017-05-31 MED ORDER — CEPHALEXIN 500 MG PO CAPS: 500.0000 mg | ORAL_CAPSULE | Freq: Two times a day (BID) | ORAL | 0 refills | Status: AC

## 2017-05-31 NOTE — Discharge Instructions (Addendum)
You are evaluated for upper abdominal pain worse with eating, and although no certain cause was found, I suspect gastritis/indigestion, and the rest of the exam and evaluation are reassuring in the emergency department today.  You may try over-the-counter Maalox and/or Tums, use as directed on labeling for indigestion symptoms.  You may try 2 weeks of over-the-counter Prilosec 40 mg once daily   We discussed your urine might have a UTI, you may hold off and call for culture results [id number]. If you have symptoms you may start it. Return to emerge department immediately for any new or worsening or uncontrolled pain, black or bloody stools, vomiting blood, fever, dizziness passing out, or any other symptoms concerning to you.

## 2017-05-31 NOTE — ED Notes (Signed)
ED Provider at bedside. 

## 2017-05-31 NOTE — ED Triage Notes (Signed)
Pt reports abdominal pain x 4 days. Pt states the pain starts in the epigastric region and radiates to her back. Pt states when she eats or drinks the pain is worse.  Denies any V/D, but reports some nausea after eating.  Pt repots no prior hx of acid reflux or gastritis.

## 2017-05-31 NOTE — ED Notes (Signed)
First nurse note: Patient ambulatory to stat desk c/o Right upper abdominal pain that radiates around to her back after eating and drinking. Pt in NAD at this time.

## 2017-05-31 NOTE — ED Provider Notes (Signed)
[facility] Provider Note ____________________________________________   I have reviewed the triage vital signs and the triage nursing note.  HISTORY  Chief Complaint Abdominal Pain   Historian Patient  HPI Donna Odonnell is a 41 y.o. female presents for evaluation for a couple of days of epigastric pain that is worse whenever she eats.  In between it is not painful.  She is not really having tenderness now.  Pain also wraps around the right side of her abdomen at times.  No lower abdominal pain.  Mild urinary hesitancy without frequency or hematuria.  No abdominal pain in the lower abdomen.  No pelvic complaints.  No fever.  No constipation or diarrhea problems.  No coughing or trouble breathing or chest pain.   History reviewed. No pertinent past medical history.  There are no active problems to display for this patient.   Past Surgical History:  Procedure Laterality Date  . CESAREAN SECTION    . TONSILLECTOMY    . TUBAL LIGATION      Prior to Admission medications   Medication Sig Start Date End Date Taking? Authorizing Provider  cyclobenzaprine (FLEXERIL) 10 MG tablet Take 1 tablet (10 mg total) by mouth at bedtime. 05/12/16   Willy Eddy, MD  Elastic Bandages & Supports (T.E.D. BELOW KNEE/MEDIUM) MISC 1 application by Does not apply route every morning. 05/12/16   Willy Eddy, MD    Allergies  Allergen Reactions  . Ciprofloxacin Hives    Family History  Problem Relation Age of Onset  . Cancer Mother   . COPD Mother   . Breast cancer Mother 39  . Breast cancer Maternal Grandmother 25    Social History Social History   Tobacco Use  . Smoking status: Current Every Day Smoker    Packs/day: 0.50    Types: Cigarettes  . Smokeless tobacco: Never Used  Substance Use Topics  . Alcohol use: No  . Drug use: No    Review of Systems  Constitutional: Negative for fever. Eyes: Negative for visual  changes. ENT: Negative for sore throat. Cardiovascular: Negative for chest pain. Respiratory: Negative for shortness of breath. Gastrointestinal: Negative for vomiting and diarrhea. Genitourinary: Negative for dysuria. Musculoskeletal: Negative for back pain. Skin: Negative for rash. Neurological: Negative for headache.  ____________________________________________   PHYSICAL EXAM:  VITAL SIGNS: ED Triage Vitals  Enc Vitals Group     BP 05/31/17 1143 129/74     Pulse Rate 05/31/17 1143 94     Resp 05/31/17 1143 18     Temp 05/31/17 1143 99.2 F (37.3 C)     Temp Source 05/31/17 1143 Oral     SpO2 05/31/17 1143 100 %     Weight 05/31/17 1143 220 lb (99.8 kg)     Height 05/31/17 1143 5\' 5"  (1.651 m)     Head Circumference --      Peak Flow --      Pain Score 05/31/17 1148 5     Pain Loc --      Pain Edu? --      Excl. in GC? --      Constitutional: Alert and oriented. Well appearing and in no distress. HEENT   Head: Normocephalic and atraumatic.      Eyes: Conjunctivae are normal. Pupils equal and round.       Ears:         Nose: No congestion/rhinnorhea.   Mouth/Throat: Mucous membranes are moist.   Neck: No stridor. Cardiovascular/Chest: Normal  rate, regular rhythm.  No murmurs, rubs, or gallops. Respiratory: Normal respiratory effort without tachypnea nor retractions. Breath sounds are clear and equal bilaterally. No wheezes/rales/rhonchi. Gastrointestinal: Soft. No distention, no guarding, no rebound. Nontender.    Genitourinary/rectal:Deferred Musculoskeletal: Nontender with normal range of motion in all extremities. No joint effusions.  No lower extremity tenderness.  No edema. Neurologic:  Normal speech and language. No gross or focal neurologic deficits are appreciated. Skin:  Skin is warm, dry and intact. No rash noted. Psychiatric: Mood and affect are normal. Speech and behavior are normal. Patient exhibits appropriate insight and  judgment.   ____________________________________________  LABS (pertinent positives/negatives) I, Governor Rooksebecca Ahmod Gillespie, MD the attending physician have reviewed the labs noted below.  Labs Reviewed  COMPREHENSIVE METABOLIC PANEL - Abnormal; Notable for the following components:      Result Value   Potassium 3.4 (*)    Glucose, Bld 109 (*)    Calcium 8.6 (*)    ALT 12 (*)    All other components within normal limits  CBC - Abnormal; Notable for the following components:   RDW 15.5 (*)    All other components within normal limits  URINALYSIS, COMPLETE (UACMP) WITH MICROSCOPIC - Abnormal; Notable for the following components:   Color, Urine YELLOW (*)    APPearance HAZY (*)    Hgb urine dipstick MODERATE (*)    Protein, ur 30 (*)    Leukocytes, UA SMALL (*)    Bacteria, UA FEW (*)    Squamous Epithelial / LPF 0-5 (*)    All other components within normal limits  URINE CULTURE  LIPASE, BLOOD  POC URINE PREG, ED  POCT PREGNANCY, URINE    ____________________________________________    EKG I, Governor Rooksebecca Mazella Deen, MD, the attending physician have personally viewed and interpreted all ECGs.  100 bpm.  Normal sinus rhythm.  Narrow QS per normal axis.  Nonspecific ST and T wave ____________________________________________  RADIOLOGY All Xrays were viewed by me.  Imaging interpreted by Radiologist, and I, Governor Rooksebecca Ger Ringenberg, MD the attending physician have reviewed the radiologist interpretation noted below.  Right upper quadrant ultrasound: IMPRESSION: Unremarkable right upper quadrant ultrasound. __________________________________________  PROCEDURES  Procedure(s) performed: None  Critical Care performed: None   ____________________________________________  ED COURSE / ASSESSMENT AND PLAN  Pertinent labs & imaging results that were available during my care of the patient were reviewed by me and considered in my medical decision making (see chart for details).   Symptoms sounds  most clinically consistent with acid reflux/indigestion or gastritis versus biliary colic.  She actually feels much improved right now and symptoms are worse when she eats.  I am going to image her right upper quadrant with ultrasound.  Likely will treat symptomatically for indigestion/gastritis.  Urinalysis shows possible urinary tract infection, patient is having a little bit of hesitancy without any symptoms that would have really concerned her about urinary tract infection.  I am going to discharge her with a prescription for Keflex in case she becomes symptomatic, in the meantime send a urine culture.  I have asked her to call back to ensure that if culture does come back positive that she does treat.  DIFFERENTIAL DIAGNOSIS: Differential diagnosis includes, but is not limited to, biliary disease (biliary colic, acute cholecystitis, cholangitis, choledocholithiasis, etc), intrathoracic causes for epigastric abdominal pain including ACS, gastritis, duodenitis, pancreatitis, small bowel or large bowel obstruction, abdominal aortic aneurysm, hernia, and gastritis.  CONSULTATIONS:  None   Patient / Family / Caregiver informed of clinical  course, medical decision-making process, and agree with plan.   I discussed return precautions, follow-up instructions, and discharge instructions with patient and/or family.  Discharge Instructions : You are evaluated for upper abdominal pain worse with eating, and although no certain cause was found, I suspect gastritis/indigestion, and the rest of the exam and evaluation are reassuring in the emergency department today.  You may try over-the-counter Maalox and/or Tums, use as directed on labeling for indigestion symptoms.  You may try 2 weeks of over-the-counter Prilosec 40 mg once daily  Return to emerge department immediately for any new or worsening or uncontrolled pain, black or bloody stools, vomiting blood, fever, dizziness passing out, or any other  symptoms concerning to you.    ___________________________________________   FINAL CLINICAL IMPRESSION(S) / ED DIAGNOSES   Final diagnoses:  Acute epigastric pain  Gastritis without bleeding, unspecified chronicity, unspecified gastritis type      ___________________________________________  ED Discharge Orders    None            Note: This dictation was prepared with Dragon dictation. Any transcriptional errors that result from this process are unintentional    Governor RooksLord, Jaleisa Brose, MD 05/31/17 1444

## 2017-06-03 LAB — URINE CULTURE: Culture: >=100000 — AB

## 2017-06-04 ENCOUNTER — Telehealth: Payer: Self-pay | Admitting: Emergency Medicine

## 2017-06-04 NOTE — Progress Notes (Signed)
ED CULTURE REPORT  Results for orders placed or performed during the hospital encounter of 05/31/17  Urine culture     Status: Abnormal   Collection Time: 05/31/17 11:55 AM  Result Value Ref Range Status   Specimen Description URINE, RANDOM  Final   Special Requests NONE  Final   Culture >=100,000 COLONIES/mL KLEBSIELLA PNEUMONIAE (A)  Final   Report Status 06/03/2017 FINAL  Final   Organism ID, Bacteria KLEBSIELLA PNEUMONIAE (A)  Final      Susceptibility   Klebsiella pneumoniae - MIC*    AMPICILLIN RESISTANT Resistant     CEFAZOLIN <=4 SENSITIVE Sensitive     CEFTRIAXONE <=1 SENSITIVE Sensitive     CIPROFLOXACIN <=0.25 SENSITIVE Sensitive     GENTAMICIN <=1 SENSITIVE Sensitive     IMIPENEM <=0.25 SENSITIVE Sensitive     NITROFURANTOIN <=16 SENSITIVE Sensitive     TRIMETH/SULFA <=20 SENSITIVE Sensitive     AMPICILLIN/SULBACTAM 4 SENSITIVE Sensitive     PIP/TAZO <=4 SENSITIVE Sensitive     Extended ESBL NEGATIVE Sensitive     * >=100,000 COLONIES/mL KLEBSIELLA PNEUMONIAE   Urine culture showed klebsiella sensitive to cefazolin. Patient was discharged on cephalexin. Spoke with MD Dr. Derrill KayGoodman who agreed that no further action was needed at this time.   Yolanda BonineHannah Lifsey, PharmD Pharmacy Resident

## 2017-06-04 NOTE — Telephone Encounter (Signed)
Pt called for urine culture results.  Has the rx for cephalexin, but has not filled pending culture.  Reviewed by dr Don Perkingveronese. Pt instructed to fill rx.

## 2018-03-21 ENCOUNTER — Emergency Department
Admission: EM | Admit: 2018-03-21 | Discharge: 2018-03-22 | Disposition: A | Discharge status: Home / Self Care | Payer: BLUE CROSS/BLUE SHIELD | Attending: Emergency Medicine | Admitting: Emergency Medicine

## 2018-03-21 ENCOUNTER — Encounter: Payer: Self-pay | Admitting: Emergency Medicine

## 2018-03-21 DIAGNOSIS — R110 Nausea: Secondary | ICD-10-CM | POA: Diagnosis not present

## 2018-03-21 DIAGNOSIS — F17210 Nicotine dependence, cigarettes, uncomplicated: Secondary | ICD-10-CM | POA: Diagnosis not present

## 2018-03-21 DIAGNOSIS — Y998 Other external cause status: Secondary | ICD-10-CM | POA: Diagnosis not present

## 2018-03-21 DIAGNOSIS — Y929 Unspecified place or not applicable: Secondary | ICD-10-CM | POA: Insufficient documentation

## 2018-03-21 DIAGNOSIS — R0989 Other specified symptoms and signs involving the circulatory and respiratory systems: Secondary | ICD-10-CM | POA: Insufficient documentation

## 2018-03-21 DIAGNOSIS — Z23 Encounter for immunization: Secondary | ICD-10-CM | POA: Insufficient documentation

## 2018-03-21 DIAGNOSIS — S20469A Insect bite (nonvenomous) of unspecified back wall of thorax, initial encounter: Secondary | ICD-10-CM | POA: Diagnosis not present

## 2018-03-21 DIAGNOSIS — Y9389 Activity, other specified: Secondary | ICD-10-CM | POA: Insufficient documentation

## 2018-03-21 DIAGNOSIS — W57XXXA Bitten or stung by nonvenomous insect and other nonvenomous arthropods, initial encounter: Secondary | ICD-10-CM | POA: Diagnosis not present

## 2018-03-21 MED ORDER — TETANUS-DIPHTH-ACELL PERTUSSIS 5-2.5-18.5 LF-MCG/0.5 IM SUSP
0.5000 mL | Freq: Once | INTRAMUSCULAR | Status: AC
  Administered 2018-03-22: 0.5 mL via INTRAMUSCULAR
  Filled 2018-03-21: qty 0.5

## 2018-03-21 MED ORDER — TRIAMCINOLONE ACETONIDE 0.5 % EX OINT: 1.0000 "application " | TOPICAL_OINTMENT | Freq: Two times a day (BID) | CUTANEOUS | 0 refills | Status: DC

## 2018-03-21 NOTE — ED Provider Notes (Signed)
[facility] Emergency Department Provider Note  ____________________________________________  Time seen: Approximately 11:14 PM  I have reviewed the triage vital signs and the nursing notes.   HISTORY  Chief Complaint Insect Bite    HPI Donna Odonnell is a 42 y.o. female that presents emergency department for evaluation of insect bite 4 days ago.  Patient states that she felt something bite her back and went to swat it but it was gone.  Area does not hurt.  No alleviating measures have been attempted.  She had some nausea and a runny nose earlier today and not sure if this is related.  She did not see a spider.  She did not see a tick.  No fever.   History reviewed. No pertinent past medical history.  There are no active problems to display for this patient.   Past Surgical History:  Procedure Laterality Date  . CESAREAN SECTION    . TONSILLECTOMY    . TUBAL LIGATION      Prior to Admission medications   Medication Sig Start Date End Date Taking? Authorizing Provider  cyclobenzaprine (FLEXERIL) 10 MG tablet Take 1 tablet (10 mg total) by mouth at bedtime. 05/12/16   Willy Eddy, MD  Elastic Bandages & Supports (T.E.D. BELOW KNEE/MEDIUM) MISC 1 application by Does not apply route every morning. 05/12/16   Willy Eddy, MD  triamcinolone ointment (KENALOG) 0.5 % Apply 1 application topically 2 (two) times daily. 03/21/18   Enid Derry, PA-C    Allergies Ciprofloxacin  Family History  Problem Relation Age of Onset  . Cancer Mother   . COPD Mother   . Breast cancer Mother 30  . Breast cancer Maternal Grandmother 40    Social History Social History   Tobacco Use  . Smoking status: Current Every Day Smoker    Packs/day: 0.50    Types: Cigarettes  . Smokeless tobacco: Never Used  Substance Use Topics  . Alcohol use: No  . Drug use: No     Review of Systems  Constitutional: No fever/chills Respiratory: No  SOB. Gastrointestinal: No abdominal pain.  No vomiting.  Musculoskeletal: Negative for musculoskeletal pain. Skin: Negative for abrasions, lacerations, ecchymosis. Positive for rash.    ____________________________________________   PHYSICAL EXAM:  VITAL SIGNS: ED Triage Vitals [03/21/18 2123]  Enc Vitals Group     BP (!) 153/92     Pulse Rate 74     Resp      Temp 98.1 F (36.7 C)     Temp src      SpO2 100 %     Weight 220 lb 0.3 oz (99.8 kg)     Height      Head Circumference      Peak Flow      Pain Score      Pain Loc      Pain Edu?      Excl. in GC?      Constitutional: Alert and oriented. Well appearing and in no acute distress. Eyes: Conjunctivae are normal. PERRL. EOMI. Head: Atraumatic. ENT:      Ears:      Nose: No congestion/rhinnorhea.      Mouth/Throat: Mucous membranes are moist.  Neck: No stridor.   Cardiovascular: Normal rate, regular rhythm.  Good peripheral circulation. Respiratory: Normal respiratory effort without tachypnea or retractions. Lungs CTAB. Good air entry to the bases with no decreased or absent breath sounds. Musculoskeletal: Full range of motion to all extremities. No gross deformities appreciated.  Neurologic:  Normal speech and language. No gross focal neurologic deficits are appreciated.  Skin:  Skin is warm, dry.  1 mm defect in skin and upper back with 1 inch of surrounding erythema.  Nontender to palpation.  No swelling or fluctuance.  Patient Psychiatric: Mood and affect are normal. Speech and behavior are normal. Patient exhibits appropriate insight and judgement.   ____________________________________________   LABS (all labs ordered are listed, but only abnormal results are displayed)  Labs Reviewed - No data to display ____________________________________________  EKG   ____________________________________________  RADIOLOGY   No results  found.  ____________________________________________    PROCEDURES  Procedure(s) performed:    Procedures    Medications  Tdap (BOOSTRIX) injection 0.5 mL (has no administration in time range)     ____________________________________________   INITIAL IMPRESSION / ASSESSMENT AND PLAN / ED COURSE  Pertinent labs & imaging results that were available during my care of the patient were reviewed by me and considered in my medical decision making (see chart for details).  Review of the [place] CSRS was performed in accordance of the [facility] prior to dispensing any controlled drugs.   Patient's diagnosis is consistent with insect bite.  No signs of infection.  Tetanus shot was updated.  Patient will be discharged home with prescriptions for triamcinalone. Patient is to follow up with primary care as directed. Patient is given ED precautions to return to the ED for any worsening or new symptoms.     ____________________________________________  FINAL CLINICAL IMPRESSION(S) / ED DIAGNOSES  Final diagnoses:  Insect bite, unspecified site, initial encounter      NEW MEDICATIONS STARTED DURING THIS VISIT:  ED Discharge Orders         Ordered    triamcinolone ointment (KENALOG) 0.5 %  2 times daily     03/21/18 2318              This chart was dictated using voice recognition software/Dragon. Despite best efforts to proofread, errors can occur which can change the meaning. Any change was purely unintentional.    Enid Derry, PA-C 03/21/18 2344    Emily Filbert, MD 03/22/18 [id number]

## 2018-03-21 NOTE — ED Notes (Signed)
Pt has small scratched bump on her back - also states had some nausea and runny nose so wasn't sure if related. The area is dime size with local redness, no swelling or heat.

## 2018-03-21 NOTE — ED Triage Notes (Signed)
Pt with insect bite, unknown source on Tuesday here to ED tonight due to nausea and runny and wants to have area assessed. Pt has raised area to the posterior back under neck, reddened area, approx. Quarter in size.

## 2018-03-22 NOTE — ED Notes (Signed)

## 2018-05-28 ENCOUNTER — Other Ambulatory Visit: Payer: Self-pay | Admitting: Internal Medicine

## 2018-05-28 DIAGNOSIS — Z1231 Encounter for screening mammogram for malignant neoplasm of breast: Secondary | ICD-10-CM

## 2018-06-09 ENCOUNTER — Ambulatory Visit
Admission: RE | Admit: 2018-06-09 | Discharge: 2018-06-09 | Disposition: A | Discharge status: Home / Self Care | Payer: BLUE CROSS/BLUE SHIELD | Source: Ambulatory Visit | Attending: Internal Medicine | Admitting: Internal Medicine

## 2018-06-09 DIAGNOSIS — Z1231 Encounter for screening mammogram for malignant neoplasm of breast: Secondary | ICD-10-CM | POA: Insufficient documentation

## 2018-08-11 ENCOUNTER — Other Ambulatory Visit: Payer: Self-pay

## 2018-08-11 ENCOUNTER — Encounter: Payer: Self-pay | Admitting: Emergency Medicine

## 2018-08-11 ENCOUNTER — Emergency Department
Admission: EM | Admit: 2018-08-11 | Discharge: 2018-08-11 | Disposition: A | Discharge status: Home / Self Care | Payer: BLUE CROSS/BLUE SHIELD | Attending: Student in an Organized Health Care Education/Training Program | Admitting: Student in an Organized Health Care Education/Training Program

## 2018-08-11 DIAGNOSIS — Z79899 Other long term (current) drug therapy: Secondary | ICD-10-CM | POA: Insufficient documentation

## 2018-08-11 DIAGNOSIS — F17210 Nicotine dependence, cigarettes, uncomplicated: Secondary | ICD-10-CM | POA: Insufficient documentation

## 2018-08-11 DIAGNOSIS — M7632 Iliotibial band syndrome, left leg: Secondary | ICD-10-CM | POA: Diagnosis not present

## 2018-08-11 DIAGNOSIS — M79605 Pain in left leg: Secondary | ICD-10-CM | POA: Diagnosis present

## 2018-08-11 MED ORDER — KETOROLAC TROMETHAMINE 30 MG/ML IJ SOLN
30.0000 mg | Freq: Once | INTRAMUSCULAR | Status: AC
  Administered 2018-08-11: 30 mg via INTRAMUSCULAR
  Filled 2018-08-11: qty 1

## 2018-08-11 NOTE — Discharge Instructions (Addendum)
Try to use a foam roller to the left thigh to decrease the inflammation on the IT band.  You could see a chiropractor they can also work on this area.  Perform the stretches that are attached to your discharge instructions.  Apply ice to the left thigh to decrease inflammation.

## 2018-08-11 NOTE — ED Triage Notes (Signed)
Pt arrived to the ED accompanied by her significant other for complaints of left leg pain. Pt reports that she was at work when she began to experience left leg pain that extended from the hip all the way down to the knee. Pt denies any injury and reports that nothing makes it better. Apt is AOx4 in no apparent distress.

## 2018-08-11 NOTE — ED Provider Notes (Signed)
[facility] Emergency Department Provider Note  ____________________________________________   First MD Initiated Contact with Patient 08/11/18 2046     (approximate)  I have reviewed the triage vital signs and the nursing notes.   HISTORY  Chief Complaint Leg Pain    HPI Donna Odonnell is a 43 y.o. female presents emergency department complaining of left leg pain.  States pain radiates from the hip to the knee.  She denies any injury.  She states that it feels very tight and is painful to touch.    History reviewed. No pertinent past medical history.  There are no active problems to display for this patient.   Past Surgical History:  Procedure Laterality Date  . CESAREAN SECTION    . TONSILLECTOMY    . TUBAL LIGATION      Prior to Admission medications   Medication Sig Start Date End Date Taking? Authorizing Provider  cyclobenzaprine (FLEXERIL) 10 MG tablet Take 1 tablet (10 mg total) by mouth at bedtime. 05/12/16   Willy Eddy, MD  Elastic Bandages & Supports (T.E.D. BELOW KNEE/MEDIUM) MISC 1 application by Does not apply route every morning. 05/12/16   Willy Eddy, MD  triamcinolone ointment (KENALOG) 0.5 % Apply 1 application topically 2 (two) times daily. 03/21/18   Enid Derry, PA-C    Allergies Ciprofloxacin  Family History  Problem Relation Age of Onset  . Cancer Mother   . COPD Mother   . Breast cancer Mother 53  . Breast cancer Maternal Grandmother 65    Social History Social History   Tobacco Use  . Smoking status: Current Every Day Smoker    Packs/day: 0.50    Types: Cigarettes  . Smokeless tobacco: Never Used  Substance Use Topics  . Alcohol use: No  . Drug use: No    Review of Systems  Constitutional: No fever/chills Eyes: No visual changes. ENT: No sore throat. Respiratory: Denies cough Genitourinary: Negative for dysuria. Musculoskeletal: Negative for back pain.  Positive for left leg pain,  denies calf pain Skin: Negative for rash.    ____________________________________________   PHYSICAL EXAM:  VITAL SIGNS: ED Triage Vitals  Enc Vitals Group     BP 08/11/18 2018 (!) 147/89     Pulse Rate 08/11/18 2018 95     Resp 08/11/18 2018 20     Temp 08/11/18 2018 100 F (37.8 C)     Temp Source 08/11/18 2018 Oral     SpO2 08/11/18 2018 98 %     Weight 08/11/18 2018 215 lb (97.5 kg)     Height 08/11/18 2018 5\' 5"  (1.651 m)     Head Circumference --      Peak Flow --      Pain Score 08/11/18 2027 10     Pain Loc --      Pain Edu? --      Excl. in GC? --     Constitutional: Alert and oriented. Well appearing and in no acute distress. Eyes: Conjunctivae are normal.  Head: Atraumatic. Nose: No congestion/rhinnorhea. Mouth/Throat: Mucous membranes are moist.   Neck:  supple no lymphadenopathy noted Cardiovascular: Normal rate, regular rhythm. Heart sounds are normal Respiratory: Normal respiratory effort.  No retractions, lungs c t a  GU: deferred Musculoskeletal: FROM all extremities, warm and well perfused, the IT band on the left thigh is very tender to palpation, bursa at the left trochanter is tender.  Neurovascular is intact. Neurologic:  Normal speech and language.  Skin:  Skin is warm, dry and intact. No rash noted. Psychiatric: Mood and affect are normal. Speech and behavior are normal.  ____________________________________________   LABS (all labs ordered are listed, but only abnormal results are displayed)  Labs Reviewed - No data to display ____________________________________________   ____________________________________________  RADIOLOGY    ____________________________________________   PROCEDURES  Procedure(s) performed: Toradol 30 mg IM   Procedures    ____________________________________________   INITIAL IMPRESSION / ASSESSMENT AND PLAN / ED COURSE  Pertinent labs & imaging results that were available during my care of the  patient were reviewed by me and considered in my medical decision making (see chart for details).   Patient is 43 year old female presents emergency department complaint of left leg pain.  The IT band on the left leg is very tender to palpation.  Trochanteric bursa is slightly tender but the IT band is more tender.  Remainder the exam is unremarkable  Patient was given Toradol 30 mg IM.  She is to follow-up with her regular doctor if not better in 5 to 7 days.  Encouraged her to see a Land.  She could also foam roll to try and loosen the inflammation on the IT band.  She states she understands will comply.  She was discharged in stable condition.     As part of my medical decision making, I reviewed the following data within the electronic MEDICAL RECORD NUMBER Nursing notes reviewed and incorporated, Old chart reviewed, Notes from prior ED visits and [place] Controlled Substance Database  ____________________________________________   FINAL CLINICAL IMPRESSION(S) / ED DIAGNOSES  Final diagnoses:  It band syndrome, left      NEW MEDICATIONS STARTED DURING THIS VISIT:  New Prescriptions   No medications on file     Note:  This document was prepared using Dragon voice recognition software and may include unintentional dictation errors.    Faythe Ghee, PA-C 08/12/18 0005    Willy Eddy, MD 08/14/18 [phone]

## 2019-01-12 ENCOUNTER — Ambulatory Visit
Admission: EM | Admit: 2019-01-12 | Discharge: 2019-01-12 | Disposition: A | Discharge status: Home / Self Care | Payer: BC Managed Care – PPO | Attending: Family Medicine | Admitting: Family Medicine

## 2019-01-12 ENCOUNTER — Other Ambulatory Visit: Payer: Self-pay

## 2019-01-12 ENCOUNTER — Ambulatory Visit (INDEPENDENT_AMBULATORY_CARE_PROVIDER_SITE_OTHER)
Admit: 2019-01-12 | Discharge: 2019-01-12 | Disposition: A | Discharge status: Home / Self Care | Payer: BC Managed Care – PPO | Attending: Family Medicine | Admitting: Family Medicine

## 2019-01-12 DIAGNOSIS — R109 Unspecified abdominal pain: Secondary | ICD-10-CM

## 2019-01-12 LAB — URINALYSIS, COMPLETE (UACMP) WITH MICROSCOPIC
Bilirubin Urine: NEGATIVE
Glucose, UA: NEGATIVE mg/dL
Ketones, ur: NEGATIVE mg/dL
Nitrite: NEGATIVE
Protein, ur: NEGATIVE mg/dL
Specific Gravity, Urine: 1.02 (ref 1.005–1.030)
pH: 5.5 (ref 5.0–8.0)

## 2019-01-12 MED ORDER — KETOROLAC TROMETHAMINE 10 MG PO TABS: 10.0000 mg | ORAL_TABLET | Freq: Four times a day (QID) | ORAL | 0 refills | Status: DC | PRN

## 2019-01-12 NOTE — ED Provider Notes (Signed)
[facility]    CSN: [id number] Arrival date & time: 01/12/19  [id number]  History   Chief Complaint Chief Complaint  Patient presents with   Flank Pain   HPI  43 year old female presents with flank pain.  Patient reports a 3-day history of left flank pain.  Was initially intermittent and sharp and now has progressed to being constant.  Currently 5/10 in severity.  Has been as high as 8/10 in severity.  She states that it seems to be worse with food.  No nausea or vomiting.  No urinary symptoms.  No diarrhea or constipation.  No documented fever.  She does have a slightly elevated temperature currently.  She has used Motrin with some improvement.  No other reported symptoms.  No other complaints.  PMH, Surgical Hx, Family Hx, Social History reviewed and updated as below.  PMH: Hx of trigger finger, Hx of gastritis, Hx of low back pain  Past Surgical History:  Procedure Laterality Date   CESAREAN SECTION     TONSILLECTOMY     TUBAL LIGATION      OB History    Gravida  3   Para      Term      Preterm      AB      Living  3     SAB      TAB      Ectopic      Multiple      Live Births             Home Medications    Prior to Admission medications   Medication Sig Start Date End Date Taking? Authorizing Provider  hydrochlorothiazide (HYDRODIURIL) 25 MG tablet Take 25 mg by mouth daily. for high blood pressure 12/15/18  Yes [provider]  ketorolac (TORADOL) 10 MG tablet Take 1 tablet (10 mg total) by mouth every 6 (six) hours as needed for moderate pain or severe pain. 01/12/19   Tommie Samsook, Kiwanna Spraker G, DO   Family History Family History  Problem Relation Age of Onset   Cancer Mother    COPD Mother    Breast cancer Mother 5650   Breast cancer Maternal Grandmother 3460   Social History Social History   Tobacco Use   Smoking status: Current Every Day Smoker    Packs/day: 0.50    Types: Cigarettes   Smokeless tobacco: Never Used    Substance Use Topics   Alcohol use: No   Drug use: No   Allergies   Ciprofloxacin   Review of Systems Review of Systems  Constitutional: Negative.   Gastrointestinal: Negative for constipation, diarrhea, nausea and vomiting.  Genitourinary: Positive for flank pain.   Physical Exam Triage Vital Signs ED Triage Vitals  Enc Vitals Group     BP 01/12/19 1015 (!) 137/97     Pulse Rate 01/12/19 1015 83     Resp 01/12/19 1015 16     Temp 01/12/19 1015 99.5 F (37.5 C)     Temp Source 01/12/19 1015 Oral     SpO2 01/12/19 1015 100 %     Weight 01/12/19 1013 220 lb (99.8 kg)     Height 01/12/19 1013 5\' 5"  (1.651 m)     Head Circumference --      Peak Flow --      Pain Score 01/12/19 1013 5     Pain Loc --      Pain Edu? --      Excl. in GC? --  Updated Vital Signs BP (!) 137/97 (BP Location: Right Arm)    Pulse 83    Temp 99.5 F (37.5 C) (Oral)    Resp 16    Ht 5\' 5"  (1.651 m)    Wt 99.8 kg    LMP 12/29/2018    SpO2 100%    BMI 36.61 kg/m   Visual Acuity Right Eye Distance:   Left Eye Distance:   Bilateral Distance:    Right Eye Near:   Left Eye Near:    Bilateral Near:     Physical Exam Vitals signs reviewed.  Constitutional:      General: She is not in acute distress.    Appearance: Normal appearance. She is obese.  HENT:     Head: Normocephalic and atraumatic.  Eyes:     General:        Right eye: No discharge.        Left eye: No discharge.     Conjunctiva/sclera: Conjunctivae normal.  Cardiovascular:     Rate and Rhythm: Normal rate and regular rhythm.  Pulmonary:     Effort: Pulmonary effort is normal.     Breath sounds: Normal breath sounds.  Abdominal:     General: There is no distension.     Palpations: Abdomen is soft.     Tenderness: There is no right CVA tenderness or left CVA tenderness.  Neurological:     Mental Status: She is alert.  Psychiatric:        Mood and Affect: Mood normal.        Behavior: Behavior normal.    UC  Treatments / Results  Labs (all labs ordered are listed, but only abnormal results are displayed) Labs Reviewed  URINALYSIS, COMPLETE (UACMP) WITH MICROSCOPIC - Abnormal; Notable for the following components:      Result Value   Hgb urine dipstick TRACE (*)    Leukocytes,Ua TRACE (*)    Bacteria, UA RARE (*)    All other components within normal limits  URINE CULTURE    EKG None  Radiology Ct Renal Stone Study  Result Date: 01/12/2019 CLINICAL DATA:  Left flank pain EXAM: CT ABDOMEN AND PELVIS WITHOUT CONTRAST TECHNIQUE: Multidetector CT imaging of the abdomen and pelvis was performed following the standard protocol without oral or IV contrast. COMPARISON:  None. FINDINGS: Lower chest: There is mild atelectatic change in the lung bases. There is no lung base edema or consolidation. Hepatobiliary: No focal liver lesions are evident on this noncontrast enhanced study. The gallbladder wall is not appreciably thickened. There is no biliary duct dilatation. Pancreas: There is no pancreatic mass or inflammatory focus. Spleen: No splenic lesions are evident. Adrenals/Urinary Tract: Adrenals bilaterally appear normal. There is no demonstrable renal mass or hydronephrosis on either side. There is no appreciable renal or ureteral calculus on either side. Urinary bladder is midline with wall thickness within normal limits. Stomach/Bowel: There is no appreciable bowel wall or mesenteric thickening. There is moderate stool throughout the colon. There are scattered colonic diverticula throughout the colon without diverticulitis. There is no evident bowel obstruction. Terminal ileum appears normal. No free air or portal venous air evident. Vascular/Lymphatic: There is mild aortic atherosclerosis. No aneurysm evident. No adenopathy evident in the abdomen or pelvis. Reproductive: The uterus is anteverted. There is a 1.5 x 1 cm probable cervical nabothian cyst. No other evident pelvic mass. Other: Appendix appears  normal. No abscess or ascites is evident in the abdomen or pelvis. Musculoskeletal: There is degenerative change  in each hip joint, slightly more severe on the left than on the right. There are no blastic or lytic bone lesions. No intramuscular or abdominal wall lesions are evident. IMPRESSION: 1. No appreciable renal or ureteral calculus. No hydronephrosis. Urinary bladder wall thickness within normal limits. 2. Scattered diverticula throughout the colon without diverticulitis. No bowel obstruction. No abscess in the abdomen pelvis. Appendix appears normal. 3. Approximately 1.5 x 1 cm probable nabothian cyst arising in cervix region. Electronically Signed   By: Lowella Grip III M.D.   On: 01/12/2019 12:01    Procedures Procedures (including critical care time)  Medications Ordered in UC Medications - No data to display  Initial Impression / Assessment and Plan / UC Course  I have reviewed the triage vital signs and the nursing notes.  Pertinent labs & imaging results that were available during my care of the patient were reviewed by me and considered in my medical decision making (see chart for details).    43 year old female presents with left flank pain. CT negative. Urine with pyuria. Sending culture. Suspect Toradol as needed. Rest. Work note given.   Final Clinical Impressions(s) / UC Diagnoses   Final diagnoses:  Flank pain     Discharge Instructions     Fluids.  Rest.  Medication as needed.  CT was negative.  Take care  Dr. Lacinda Axon     ED Prescriptions    Medication Sig Dispense Auth. Provider   ketorolac (TORADOL) 10 MG tablet Take 1 tablet (10 mg total) by mouth every 6 (six) hours as needed for moderate pain or severe pain. 20 tablet Coral Spikes, DO     Controlled Substance Prescriptions [place] Controlled Substance Registry consulted? Not Applicable   Coral Spikes, DO 01/12/19 1236

## 2019-01-12 NOTE — ED Triage Notes (Signed)
Patient complains of left flank pain states that at times the pain radiates to the front. Patient states that pain has been intermittent for 3 days but since this morning pain has remained constant.

## 2019-01-12 NOTE — ED Notes (Signed)
CT Authorization obtained from insurance. Authorization #[id number]

## 2019-01-12 NOTE — Discharge Instructions (Signed)
Fluids.  Rest.  Medication as needed.  CT was negative.  Take care  Dr. Lacinda Axon

## 2019-01-14 ENCOUNTER — Encounter (HOSPITAL_COMMUNITY): Payer: Self-pay

## 2019-01-14 LAB — URINE CULTURE: Culture: 60000 — AB

## 2019-01-15 ENCOUNTER — Telehealth (HOSPITAL_COMMUNITY): Payer: Self-pay | Admitting: Emergency Medicine

## 2019-01-15 NOTE — Telephone Encounter (Signed)
Contacted patient, she is not having any urinary symptoms. Only flank pain. Per Dr. Mannie Stabile, have patient monitor symptoms for the next few days, if feeling worse, call us back. Otherwise keep taking pain medicine. Pt agreeable to plan. All questions answered.

## 2019-03-16 ENCOUNTER — Other Ambulatory Visit: Payer: Self-pay

## 2019-03-16 ENCOUNTER — Ambulatory Visit: Payer: Self-pay

## 2019-03-16 VITALS — BP 130/80 | HR 93 | Temp 99.0°F | Resp 16

## 2019-03-16 DIAGNOSIS — R52 Pain, unspecified: Secondary | ICD-10-CM

## 2019-03-16 DIAGNOSIS — M5136 Other intervertebral disc degeneration, lumbar region: Secondary | ICD-10-CM | POA: Diagnosis not present

## 2019-03-16 DIAGNOSIS — Z0130 Encounter for examination of blood pressure without abnormal findings: Secondary | ICD-10-CM

## 2019-03-16 DIAGNOSIS — M5442 Lumbago with sciatica, left side: Secondary | ICD-10-CM | POA: Diagnosis not present

## 2019-03-16 DIAGNOSIS — M545 Low back pain: Secondary | ICD-10-CM | POA: Diagnosis not present

## 2019-03-16 DIAGNOSIS — M1612 Unilateral primary osteoarthritis, left hip: Secondary | ICD-10-CM | POA: Diagnosis not present

## 2019-03-16 MED ORDER — ACETAMINOPHEN 500 MG PO TABS
1000.0000 mg | ORAL_TABLET | Freq: Once | ORAL | Status: AC
Start: 2019-03-16 — End: 2019-03-16
  Administered 2019-03-16: 1000 mg via ORAL

## 2019-03-16 MED ORDER — ACETAMINOPHEN 500 MG PO TABS: 500.0000 mg | ORAL_TABLET | Freq: Once | ORAL | Status: DC

## 2019-03-16 NOTE — Patient Instructions (Signed)
Shoulder Pain Many things can cause shoulder pain, including:  An injury.  Moving the shoulder in the same way again and again (overuse).  Joint pain (arthritis). Pain can come from:  Swelling and irritation (inflammation) of any part of the shoulder.  An injury to the shoulder joint.  An injury to: ? Tissues that connect muscle to bone (tendons). ? Tissues that connect bones to each other (ligaments). ? Bones. Follow these instructions at home: Watch for changes in your symptoms. Let your doctor know about them. Follow these instructions to help with your pain. If you have a sling:  Wear the sling as told by your doctor. Remove it only as told by your doctor.  Loosen the sling if your fingers: ? Tingle. ? Become numb. ? Turn cold and blue.  Keep the sling clean.  If the sling is not waterproof: ? Do not let it get wet. ? Take the sling off when you shower or bathe. Managing pain, stiffness, and swelling   If told, put ice on the painful area: ? Put ice in a plastic bag. ? Place a towel between your skin and the bag. ? Leave the ice on for 20 minutes, 2-3 times a day. Stop putting ice on if it does not help with the pain.  Squeeze a soft ball or a foam pad as much as possible. This prevents swelling in the shoulder. It also helps to strengthen the arm. General instructions  Take over-the-counter and prescription medicines only as told by your doctor.  Keep all follow-up visits as told by your doctor. This is important. Contact a doctor if:  Your pain gets worse.  Medicine does not help your pain.  You have new pain in your arm, hand, or fingers. Get help right away if:  Your arm, hand, or fingers: ? Tingle. ? Are numb. ? Are swollen. ? Are painful. ? Turn white or blue. Summary  Shoulder pain can be caused by many things. These include injury, moving the shoulder in the same away again and again, and joint pain.  Watch for changes in your symptoms.  Let your doctor know about them.  This condition may be treated with a sling, ice, and pain medicine.  Contact your doctor if the pain gets worse or you have new pain. Get help right away if your arm, hand, or fingers tingle or get numb, swollen, or painful.  Keep all follow-up visits as told by your doctor. This is important. This information is not intended to replace advice given to you by your health care provider. Make sure you discuss any questions you have with your health care provider. Document Released: 12/18/2007 Document Revised: 01/13/2018 Document Reviewed: 01/13/2018 Elsevier Patient Education  2020 [employer] Acetaminophen tablets or caplets What is this medicine? ACETAMINOPHEN (a set a MEE noe fen) is a pain reliever. It is used to treat mild pain and fever. This medicine may be used for other purposes; ask your health care provider or pharmacist if you have questions. COMMON BRAND NAME(S): Aceta, Actamin, Anacin Aspirin Free, Genapap, Genebs, Mapap, Pain & Fever, Pain and Fever, PAIN RELIEF, PAIN RELIEF Extra Strength, Pain Reliever, Panadol, PHARBETOL, Q-Pap, Q-Pap Extra Strength, Tylenol, Tylenol CrushableTablet, Tylenol Extra Strength, XS No Aspirin, XS Pain Reliever What should I tell my health care provider before I take this medicine? They need to know if you have any of these conditions:  if you often drink alcohol  liver disease  an unusual or allergic reaction  to acetaminophen, other medicines, foods, dyes, or preservatives  pregnant or trying to get pregnant  breast-feeding How should I use this medicine? Take this medicine by mouth with a glass of water. Follow the directions on the package or prescription label. Take your medicine at regular intervals. Do not take your medicine more often than directed. Talk to your pediatrician regarding the use of this medicine in children. While this drug may be prescribed for children as young as 63 years of age for  selected conditions, precautions do apply. Overdosage: If you think you have taken too much of this medicine contact a poison control center or emergency room at once. NOTE: This medicine is only for you. Do not share this medicine with others. What if I miss a dose? If you miss a dose, take it as soon as you can. If it is almost time for your next dose, take only that dose. Do not take double or extra doses. What may interact with this medicine?  alcohol  imatinib  isoniazid  other medicines with acetaminophen This list may not describe all possible interactions. Give your health care provider a list of all the medicines, herbs, non-prescription drugs, or dietary supplements you use. Also tell them if you smoke, drink alcohol, or use illegal drugs. Some items may interact with your medicine. What should I watch for while using this medicine? Tell your doctor or health care professional if the pain lasts more than 10 days (5 days for children), if it gets worse, or if there is a new or different kind of pain. Also, check with your doctor if a fever lasts for more than 3 days. Do not take other medicines that contain acetaminophen with this medicine. Always read labels carefully. If you have questions, ask your doctor or pharmacist. If you take too much acetaminophen get medical help right away. Too much acetaminophen can be very dangerous and cause liver damage. Even if you do not have symptoms, it is important to get help right away. What side effects may I notice from receiving this medicine? Side effects that you should report to your doctor or health care professional as soon as possible:  allergic reactions like skin rash, itching or hives, swelling of the face, lips, or tongue  breathing problems  fever or sore throat  redness, blistering, peeling or loosening of the skin, including inside the mouth  trouble passing urine or change in the amount of urine  unusual bleeding or  bruising  unusually weak or tired  yellowing of the eyes or skin Side effects that usually do not require medical attention (report to your doctor or health care professional if they continue or are bothersome):  headache  nausea, stomach upset This list may not describe all possible side effects. Call your doctor for medical advice about side effects. You may report side effects to FDA at 1-800-FDA-1088. Where should I keep my medicine? Keep out of reach of children. Store at room temperature between 20 and 25 degrees C (68 and 77 degrees F). Protect from moisture and heat. Throw away any unused medicine after the expiration date. NOTE: This sheet is a summary. It may not cover all possible information. If you have questions about this medicine, talk to your doctor, pharmacist, or health care provider.  2020 Elsevier/Gold Standard (2013-02-22 12:54:16)

## 2019-03-16 NOTE — Progress Notes (Addendum)
   Subjective:    Patient ID: Donna Odonnell, female    DOB: 1976/03/29, 43 y.o.   MRN: [id number]  Presented to clinic with request to check BP. Stated she feels like something is different and states she is have LEFT clavicle aches that radiates to her LEFT arm that feels like pin needles. She doesn't recall any injury but relates it to repetitive duties of her job.  Shoulder Pain  The pain is present in the left shoulder and left arm. This is a new problem. The current episode started today. There has been no history of extremity trauma. The quality of the pain is described as aching. The pain is at a severity of 7/10. The pain is mild. She has tried acetaminophen and movement for the symptoms.      Review of Systems  All other systems reviewed and are negative.      Objective:   Physical Exam Vitals signs and nursing note reviewed.     Assessment & Plan:   Wt Readings from Last 3 Encounters:  01/12/19 220 lb (99.8 kg)  08/11/18 215 lb (97.5 kg)  03/21/18 220 lb 0.3 oz (99.8 kg)   Temp Readings from Last 3 Encounters:  03/16/19 99 F (37.2 C) (Oral)  01/12/19 99.5 F (37.5 C) (Oral)  08/11/18 99 F (37.2 C) (Oral)   BP Readings from Last 3 Encounters:  03/16/19 130/80  01/12/19 (!) 137/97  08/11/18 131/87   Pulse Readings from Last 3 Encounters:  03/16/19 93  01/12/19 83  08/11/18 73     No results found for: POCGLU         Thank you!!  [facility] Nurse Specialist [place]: [phone]  Cell:  [phone] Website: [email or web address]

## 2019-04-11 IMAGING — MG 2D DIGITAL SCREENING BILATERAL MAMMOGRAM WITH CAD AND ADJUNCT TO
8 of 11 series · 8 of 27 positions shown · non-contrast
Comparison: Previous exam(s).

CLINICAL DATA: Screening.

EXAM:
2D DIGITAL SCREENING BILATERAL MAMMOGRAM WITH CAD AND ADJUNCT TOMO

[R MLO synth-2D]
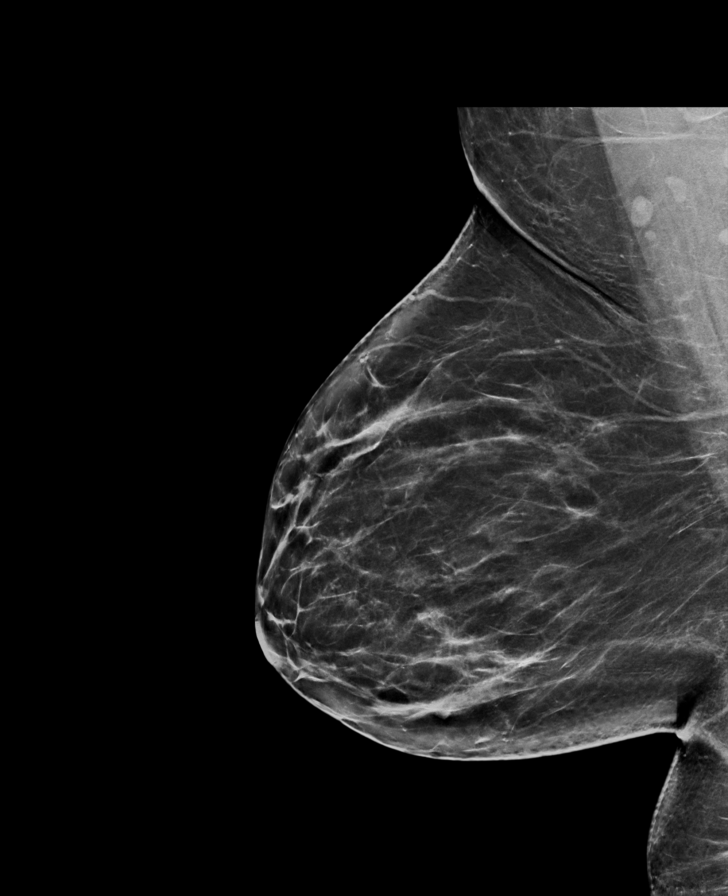

[L MLO]
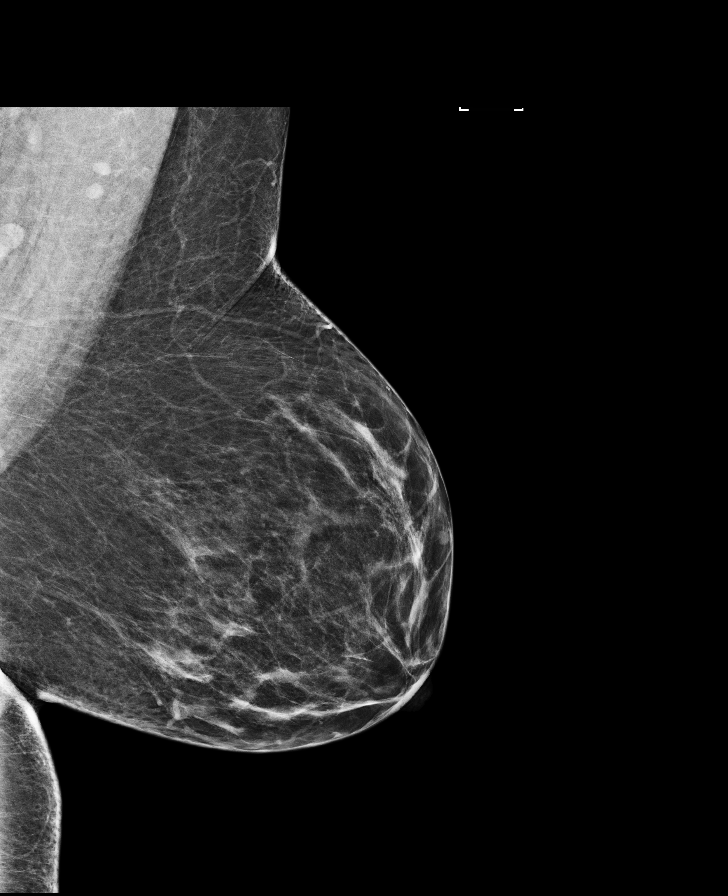

[L MLO synth-2D]
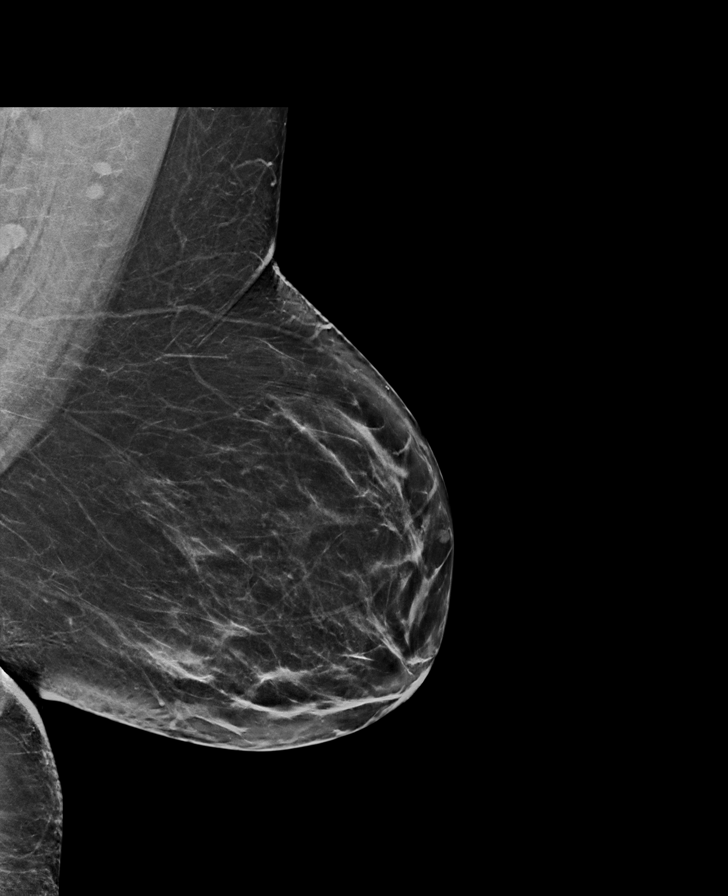

[L CC synth-2D]
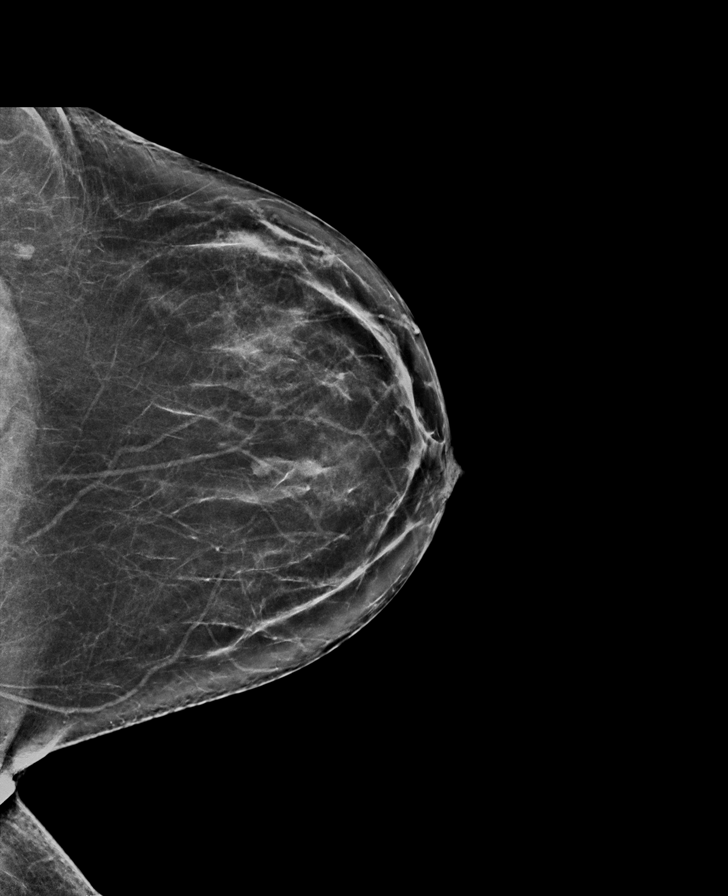

[R MLO]
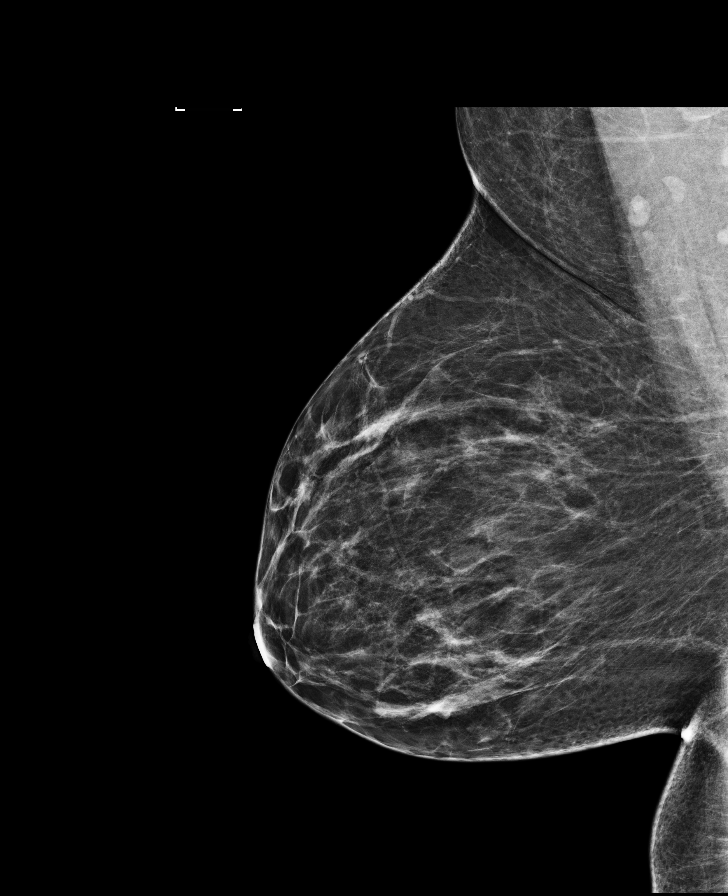

[L CC]
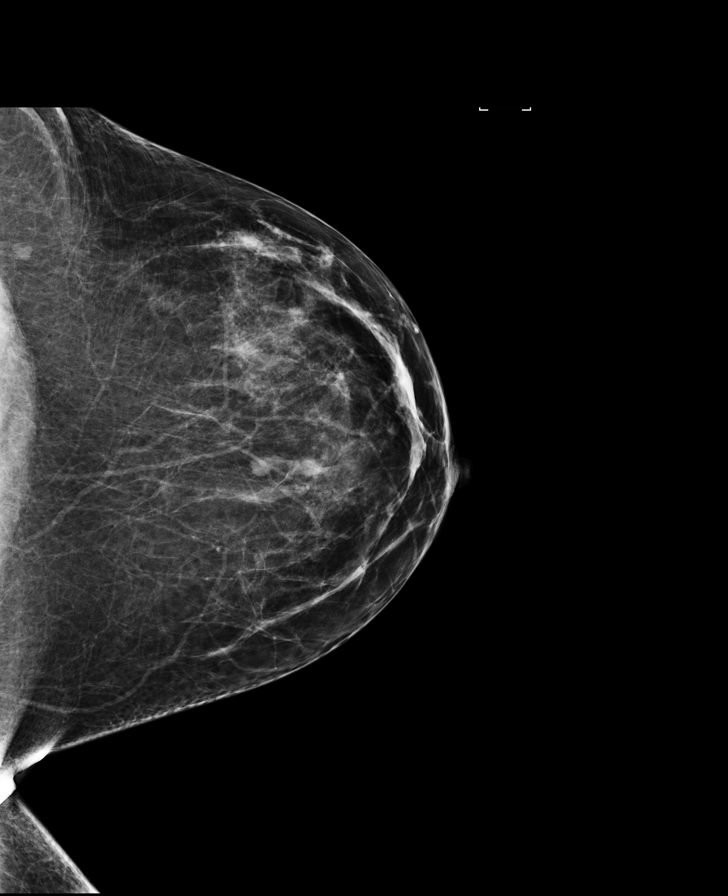

[R CC synth-2D]
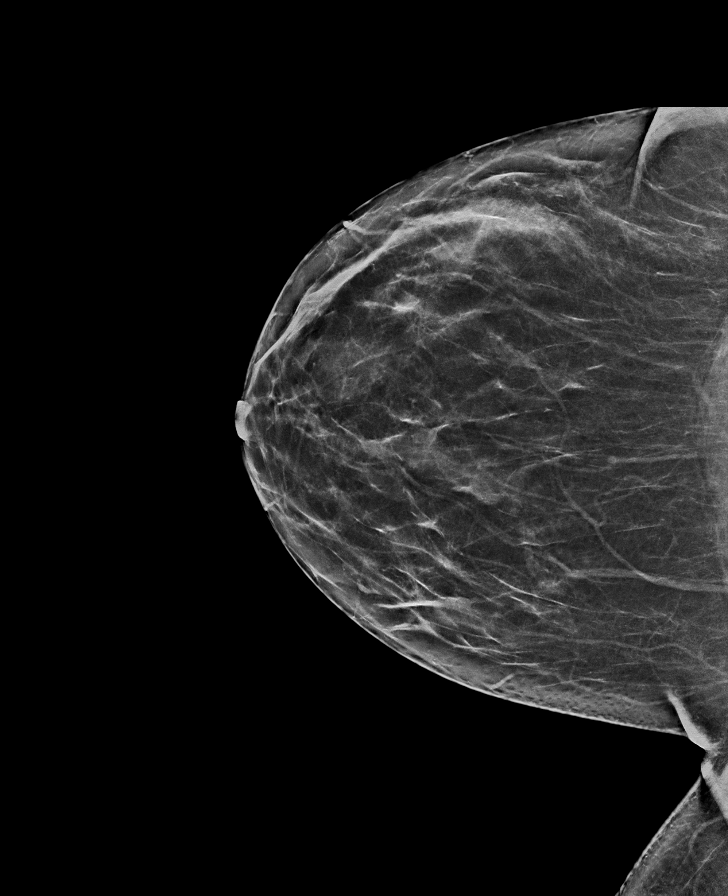

[R CC tomo · tomo slice 35/69.0]
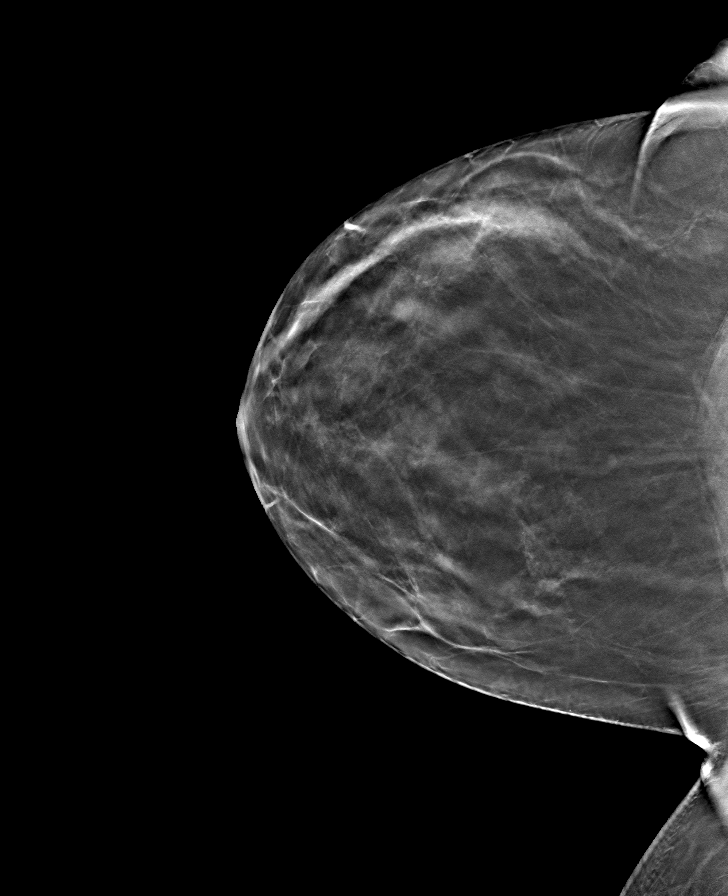

[8 of 27 positions shown; findings below may reference images not displayed]

ACR Breast Density Category b: There are scattered areas of
fibroglandular density.
FINDINGS: In the left breast, a possible asymmetry warrants further
evaluation. In the right breast, no findings suspicious for
malignancy. Images were processed with CAD.
IMPRESSION: Further evaluation is suggested for possible asymmetry in the left
breast.

RECOMMENDATION:
Diagnostic mammogram and possibly ultrasound of the left breast.
(Code:[id number])

The patient will be contacted regarding the findings, and additional
imaging will be scheduled.

BI-RADS CATEGORY  0: Incomplete. Need additional imaging evaluation
and/or prior mammograms for comparison.

## 2019-04-22 ENCOUNTER — Ambulatory Visit: Payer: Self-pay

## 2019-04-22 ENCOUNTER — Other Ambulatory Visit: Payer: Self-pay

## 2019-04-22 VITALS — BP 130/78 | HR 96 | Resp 16 | Ht 65.0 in | Wt 239.0 lb

## 2019-04-22 DIAGNOSIS — Z008 Encounter for other general examination: Secondary | ICD-10-CM

## 2019-04-22 LAB — POCT LIPID PANEL
HDL: 61
LDL: 113
Non-HDL: 133
POC Glucose: 161 mg/dl — AB (ref 70–99)
TC/HDL: 3.2
TC: 194
TRG: 98

## 2019-04-22 NOTE — Progress Notes (Signed)
     Patient ID: Donna Odonnell, female    DOB: 09-25-1975, 43 y.o.   MRN: [id number]    Thank you!!  [facility] Nurse Specialist [place]: [phone]  Cell:  [phone] Website: [email or web address]

## 2019-04-26 ENCOUNTER — Other Ambulatory Visit: Payer: Self-pay

## 2019-04-26 DIAGNOSIS — Z20822 Contact with and (suspected) exposure to covid-19: Secondary | ICD-10-CM

## 2019-04-26 DIAGNOSIS — Z20828 Contact with and (suspected) exposure to other viral communicable diseases: Secondary | ICD-10-CM | POA: Diagnosis not present

## 2019-04-27 ENCOUNTER — Telehealth: Payer: Self-pay

## 2019-04-27 LAB — NOVEL CORONAVIRUS, NAA: SARS-CoV-2, NAA: NOT DETECTED

## 2019-04-27 NOTE — Telephone Encounter (Signed)
Received call from patient stating her COVID-19 results were negative.

## 2019-04-27 NOTE — Telephone Encounter (Signed)
Late entry:  Received call stating she had low grade fever (99.1) 04/25/2019 and sore throat. On 04/26/19 stated she felt somewhat better but had scratchy throat. Patient states that she has 2 sons that tested on 04/26/19 due to having COVID symptoms x4 days. Due to patient symptoms she has been advised to test.

## 2019-05-05 DIAGNOSIS — Z1231 Encounter for screening mammogram for malignant neoplasm of breast: Secondary | ICD-10-CM | POA: Diagnosis not present

## 2019-06-17 DIAGNOSIS — Z1389 Encounter for screening for other disorder: Secondary | ICD-10-CM | POA: Diagnosis not present

## 2019-06-17 DIAGNOSIS — N760 Acute vaginitis: Secondary | ICD-10-CM | POA: Diagnosis not present

## 2019-06-20 ENCOUNTER — Other Ambulatory Visit: Payer: Self-pay

## 2019-06-20 ENCOUNTER — Emergency Department
Admission: EM | Admit: 2019-06-20 | Discharge: 2019-06-20 | Disposition: A | Discharge status: Home / Self Care | Payer: BC Managed Care – PPO | Attending: Student in an Organized Health Care Education/Training Program | Admitting: Student in an Organized Health Care Education/Training Program

## 2019-06-20 DIAGNOSIS — F17210 Nicotine dependence, cigarettes, uncomplicated: Secondary | ICD-10-CM | POA: Insufficient documentation

## 2019-06-20 DIAGNOSIS — Z20828 Contact with and (suspected) exposure to other viral communicable diseases: Secondary | ICD-10-CM | POA: Diagnosis not present

## 2019-06-20 DIAGNOSIS — R519 Headache, unspecified: Secondary | ICD-10-CM | POA: Insufficient documentation

## 2019-06-20 DIAGNOSIS — R112 Nausea with vomiting, unspecified: Secondary | ICD-10-CM | POA: Insufficient documentation

## 2019-06-20 LAB — CBC WITH DIFFERENTIAL/PLATELET
Abs Immature Granulocytes: 0.02 10*3/uL (ref 0.00–0.07)
Basophils Absolute: 0 10*3/uL (ref 0.0–0.1)
Basophils Relative: 0 %
Eosinophils Absolute: 0.2 10*3/uL (ref 0.0–0.5)
Eosinophils Relative: 2 %
HCT: 37.7 % (ref 36.0–46.0)
Hemoglobin: 12.6 g/dL (ref 12.0–15.0)
Immature Granulocytes: 0 %
Lymphocytes Relative: 44 %
Lymphs Abs: 3.2 10*3/uL (ref 0.7–4.0)
MCH: 27.6 pg (ref 26.0–34.0)
MCHC: 33.4 g/dL (ref 30.0–36.0)
MCV: 82.5 fL (ref 80.0–100.0)
Monocytes Absolute: 0.6 10*3/uL (ref 0.1–1.0)
Monocytes Relative: 8 %
Neutro Abs: 3.2 10*3/uL (ref 1.7–7.7)
Neutrophils Relative %: 46 %
Platelets: 291 10*3/uL (ref 150–400)
RBC: 4.57 MIL/uL (ref 3.87–5.11)
RDW: 14.7 % (ref 11.5–15.5)
WBC: 7.1 10*3/uL (ref 4.0–10.5)
nRBC: 0 % (ref 0.0–0.2)

## 2019-06-20 LAB — URINALYSIS, COMPLETE (UACMP) WITH MICROSCOPIC
Bacteria, UA: NONE SEEN
Bilirubin Urine: NEGATIVE
Glucose, UA: NEGATIVE mg/dL
Hgb urine dipstick: NEGATIVE
Ketones, ur: NEGATIVE mg/dL
Nitrite: NEGATIVE
Protein, ur: NEGATIVE mg/dL
Specific Gravity, Urine: 1.018 (ref 1.005–1.030)
pH: 5 (ref 5.0–8.0)

## 2019-06-20 LAB — COMPREHENSIVE METABOLIC PANEL
ALT: 12 U/L (ref 0–44)
AST: 14 U/L — ABNORMAL LOW (ref 15–41)
Albumin: 3.8 g/dL (ref 3.5–5.0)
Alkaline Phosphatase: 40 U/L (ref 38–126)
Anion gap: 8 (ref 5–15)
BUN: 13 mg/dL (ref 6–20)
CO2: 24 mmol/L (ref 22–32)
Calcium: 8.7 mg/dL — ABNORMAL LOW (ref 8.9–10.3)
Chloride: 105 mmol/L (ref 98–111)
Creatinine, Ser: 0.75 mg/dL (ref 0.44–1.00)
GFR calc Af Amer: >60 mL/min (ref >60)
GFR calc non Af Amer: >60 mL/min (ref >60)
Glucose, Bld: 90 mg/dL (ref 70–99)
Potassium: 3.8 mmol/L (ref 3.5–5.1)
Sodium: 137 mmol/L (ref 135–145)
Total Bilirubin: 0.6 mg/dL (ref 0.3–1.2)
Total Protein: 7.3 g/dL (ref 6.5–8.1)

## 2019-06-20 LAB — POCT PREGNANCY, URINE: Preg Test, Ur: NEGATIVE

## 2019-06-20 MED ORDER — ONDANSETRON 4 MG PO TBDP
4.0000 mg | ORAL_TABLET | Freq: Once | ORAL | Status: AC
  Administered 2019-06-20: 21:00:00 4 mg via ORAL
  Filled 2019-06-20: qty 1

## 2019-06-20 MED ORDER — SODIUM CHLORIDE 0.9 % IV BOLUS
1000.0000 mL | Freq: Once | INTRAVENOUS | Status: AC
  Administered 2019-06-20: 1000 mL via INTRAVENOUS

## 2019-06-20 MED ORDER — ONDANSETRON HCL 4 MG PO TABS: 4.0000 mg | ORAL_TABLET | Freq: Every day | ORAL | 0 refills | Status: DC | PRN

## 2019-06-20 NOTE — ED Triage Notes (Signed)
Pt states began to have a headache on Friday with associated nausea and vomiting. Pt denies diarrhea, known fever, cough or sore throat. Pt states headache ended today, but continues to have vomiting. Pt states last emesis 2 hours pta. Pt appears in no acute distress.

## 2019-06-20 NOTE — ED Notes (Signed)
Peripheral IV discontinued. Catheter intact. No signs of infiltration or redness. Gauze applied to IV site.   Discharge instructions reviewed with patient. Questions fielded by this RN. Patient verbalizes understanding of instructions. Patient discharged home in stable condition per robinson. No acute distress noted at time of discharge.   

## 2019-06-20 NOTE — ED Notes (Signed)
EDP at bedside  Pt able to keep down broth and ginger ale

## 2019-06-20 NOTE — ED Notes (Addendum)
Pt reports N/V since Friday, pt reports starting Flagyl for BV on Thursday but taking regular meds for HTN; pt reports taking this ABX prior without GI discomfort  Family at bedside   Pt reports 5 x vomiting in last 24 hours, pt reports that fluids stay down but not food

## 2019-06-20 NOTE — ED Provider Notes (Signed)
[facility] Emergency Department Provider Note  ____________________________________________  Time seen: Approximately 8:10 PM  I have reviewed the triage vital signs and the nursing notes.   HISTORY  Chief Complaint Emesis and Headache    HPI Donna Odonnell is a 43 y.o. female with an unremarkable past medical history, presents to the emergency department with headache and emesis for the past 24 hours.  Patient reports that she recently started Flagyl for bacterial vaginosis but states that she has taken medication in the past and has tolerated it well.  She denies using Flagyl in correlation with alcohol consumption.  No associated rhinorrhea, nasal congestion or nonproductive cough.  Patient does endorse some general myalgias.  No diarrhea.  No travel.  No known contacts with COVID-19.  Patient denies chest pain, chest tightness or abdominal pain. No other alleviating measures have been attempted.         No past medical history on file.  There are no active problems to display for this patient.   Past Surgical History:  Procedure Laterality Date  . CESAREAN SECTION    . TONSILLECTOMY    . TUBAL LIGATION      Prior to Admission medications   Medication Sig Start Date End Date Taking? Authorizing Provider  hydrochlorothiazide (HYDRODIURIL) 25 MG tablet Take 25 mg by mouth daily. for high blood pressure 12/15/18   [provider]  ketorolac (TORADOL) 10 MG tablet Take 1 tablet (10 mg total) by mouth every 6 (six) hours as needed for moderate pain or severe pain. 01/12/19   Tommie Sams, DO    Allergies Ciprofloxacin  Family History  Problem Relation Age of Onset  . Cancer Mother   . COPD Mother   . Breast cancer Mother 4  . Breast cancer Maternal Grandmother 46    Social History Social History   Tobacco Use  . Smoking status: Current Every Day Smoker    Packs/day: 0.50    Types: Cigarettes  . Smokeless tobacco: Never Used   Substance Use Topics  . Alcohol use: No  . Drug use: No     Review of Systems  Constitutional: No fever/chills Eyes: No visual changes. No discharge ENT: No upper respiratory complaints. Cardiovascular: no chest pain. Respiratory: no cough. No SOB. Gastrointestinal: No abdominal pain. Patient has emesis. No diarrhea.  No constipation. Genitourinary: Negative for dysuria. No hematuria Musculoskeletal: Negative for musculoskeletal pain. Skin: Negative for rash, abrasions, lacerations, ecchymosis. Neurological: Patient has headache.   ____________________________________________   PHYSICAL EXAM:  VITAL SIGNS: ED Triage Vitals  Enc Vitals Group     BP 06/20/19 1903 137/89     Pulse Rate 06/20/19 1903 83     Resp 06/20/19 1903 14     Temp 06/20/19 1903 99.4 F (37.4 C)     Temp Source 06/20/19 1903 Oral     SpO2 06/20/19 1903 100 %     Weight 06/20/19 1904 240 lb (108.9 kg)     Height 06/20/19 1904 5\' 5"  (1.651 m)     Head Circumference --      Peak Flow --      Pain Score 06/20/19 1903 0     Pain Loc --      Pain Edu? --      Excl. in GC? --      Constitutional: Alert and oriented. Well appearing and in no acute distress. Eyes: Conjunctivae are normal. PERRL. EOMI. Head: Atraumatic. ENT:      Ears: TMs  are effused bilaterally.       Nose: No congestion/rhinnorhea.      Mouth/Throat: Mucous membranes are moist.  Neck: No stridor.  No cervical spine tenderness to palpation. Cardiovascular: Normal rate, regular rhythm. Normal S1 and S2.  Good peripheral circulation. Respiratory: Normal respiratory effort without tachypnea or retractions. Lungs CTAB. Good air entry to the bases with no decreased or absent breath sounds. Gastrointestinal: Bowel sounds 4 quadrants. Soft and nontender to palpation. No guarding or rigidity. No palpable masses. No distention. No CVA tenderness. Musculoskeletal: Full range of motion to all extremities. No gross deformities  appreciated. Neurologic:  Normal speech and language. No gross focal neurologic deficits are appreciated.  Skin:  Skin is warm, dry and intact. No rash noted. Psychiatric: Mood and affect are normal. Speech and behavior are normal. Patient exhibits appropriate insight and judgement.   ____________________________________________   LABS (all labs ordered are listed, but only abnormal results are displayed)  Labs Reviewed  SARS CORONAVIRUS 2 (TAT 6-24 HRS)  CBC WITH DIFFERENTIAL/PLATELET  COMPREHENSIVE METABOLIC PANEL  URINALYSIS, COMPLETE (UACMP) WITH MICROSCOPIC  POC URINE PREG, ED   ____________________________________________  EKG   ____________________________________________  RADIOLOGY I personally viewed and evaluated these images as part of my medical decision making, as well as reviewing the written report by the radiologist.    No results found.  ____________________________________________    PROCEDURES  Procedure(s) performed:    Procedures    Medications  sodium chloride 0.9 % bolus 1,000 mL (has no administration in time range)  ondansetron (ZOFRAN-ODT) disintegrating tablet 4 mg (has no administration in time range)     ____________________________________________   INITIAL IMPRESSION / ASSESSMENT AND PLAN / ED COURSE  Pertinent labs & imaging results that were available during my care of the patient were reviewed by me and considered in my medical decision making (see chart for details).  Review of the [place] CSRS was performed in accordance of the [place] prior to dispensing any controlled drugs.           Assessment and Plan: Headache Nausea/vomiting 43 year old female presents to the emergency department with headache and nausea/vomiting for the past 24 hours.    Vital signs were reassuring at triage.  On physical exam, patient was resting comfortably in a supine position with emesis bag close by.  No active vomiting during interview.   Mucous membranes seem moist.  Abdomen was soft and nontender without guarding.  Differential diagnosis includes viral gastroenteritis, medication side effect, pregnancy, COVID-19, pyelonephritis...  Work-up in progress at this time.  Patient care and report was turned over to attending, Dr. Quentin Cornwall.  ____________________________________________  FINAL CLINICAL IMPRESSION(S) / ED DIAGNOSES  Final diagnoses:  Non-intractable vomiting with nausea, unspecified vomiting type  Acute nonintractable headache, unspecified headache type      NEW MEDICATIONS STARTED DURING THIS VISIT:  ED Discharge Orders    None          This chart was dictated using voice recognition software/Dragon. Despite best efforts to proofread, errors can occur which can change the meaning. Any change was purely unintentional.    Lannie Fields, PA-C 06/20/19 2054    Merlyn Lot, MD 06/20/19 2306

## 2019-06-22 ENCOUNTER — Telehealth: Payer: Self-pay

## 2019-06-22 NOTE — Telephone Encounter (Signed)
Placed call to patient to see how she is feeling. She is still waiting for results at this time.

## 2019-06-23 ENCOUNTER — Ambulatory Visit: Payer: Self-pay | Admitting: *Deleted

## 2019-06-23 ENCOUNTER — Telehealth: Payer: Self-pay

## 2019-06-23 ENCOUNTER — Other Ambulatory Visit: Payer: Self-pay

## 2019-06-23 DIAGNOSIS — Z20822 Contact with and (suspected) exposure to covid-19: Secondary | ICD-10-CM

## 2019-06-23 NOTE — Telephone Encounter (Signed)
Contacted pt due to her concerns about her COVID test from hospital visit was cancelled; she was found out her test in the ED was cancelled; she says a swab was done in the ED; the pt says she needs a negative test to return to work; pt advise to contact her PCP for recommendations, or be tested at a community or commercial site; she verbalized understanding.   Reason for Disposition . Health Information question, no triage required and triager able to answer question  Answer Assessment - Initial Assessment Questions 1. REASON FOR CALL or QUESTION: "What is your reason for calling today?" or "How can I best help you?" or "What question do you have that I can help answer?"     COVID test in ED cancelled  Protocols used: Hand

## 2019-06-23 NOTE — Telephone Encounter (Signed)
Received call from patient asking if her COVID-19 results have posted. Advised she should contact the ER because test appears to be canceled. She was advised she will need the results in order to return to work.

## 2019-06-25 ENCOUNTER — Telehealth: Payer: Self-pay

## 2019-06-25 LAB — NOVEL CORONAVIRUS, NAA: SARS-CoV-2, NAA: NOT DETECTED

## 2019-06-25 NOTE — Telephone Encounter (Signed)
Updated with negative test results; no further symptoms noted.

## 2019-07-05 ENCOUNTER — Ambulatory Visit: Payer: BC Managed Care – PPO | Attending: Internal Medicine

## 2019-07-05 DIAGNOSIS — Z20822 Contact with and (suspected) exposure to covid-19: Secondary | ICD-10-CM

## 2019-07-05 DIAGNOSIS — Z20828 Contact with and (suspected) exposure to other viral communicable diseases: Secondary | ICD-10-CM | POA: Diagnosis not present

## 2019-07-06 LAB — NOVEL CORONAVIRUS, NAA: SARS-CoV-2, NAA: DETECTED — AB

## 2019-07-11 ENCOUNTER — Ambulatory Visit: Payer: Self-pay

## 2019-07-11 ENCOUNTER — Telehealth: Payer: Self-pay

## 2019-07-11 NOTE — Telephone Encounter (Addendum)
Received call from patient stating she continues to be symptomatic. Note from [facility] not releasing until 07/18/18 pending symptoms have subsided.

## 2019-07-11 NOTE — Telephone Encounter (Signed)
Pt is calling when to end isolation but pt is still having sx. Pt advised to continue quarantine and note provided for employer. Pt is still c/o abdominal pain and lower back pain that is moderate in severity and 1 episode of diarrhea. Pt has had intermittent, sharp "nagging"  abdominal pain for past 3 days. Pt stated the pain is worse at bedtime. Pt is also mild nausea.  Pt is afebrile.  Pt advised to go to ED if pain is constant and severe. Pt stated that she will call her doctor. Pt has nasal congestion, constant headache. Pt Excedrin migraine OTC and get relief.  Sent pt a note via MyChart to stay in quarantine. Pt verbalized understanding.  Reason for Disposition . Health Information question, no triage required and triager able to answer question  Protocols used: INFORMATION ONLY CALL - NO TRIAGE-A-AH

## 2019-07-30 DIAGNOSIS — M1611 Unilateral primary osteoarthritis, right hip: Secondary | ICD-10-CM | POA: Diagnosis not present

## 2019-07-30 DIAGNOSIS — M25551 Pain in right hip: Secondary | ICD-10-CM | POA: Diagnosis not present

## 2019-07-30 DIAGNOSIS — M1612 Unilateral primary osteoarthritis, left hip: Secondary | ICD-10-CM | POA: Diagnosis not present

## 2019-07-30 DIAGNOSIS — M5136 Other intervertebral disc degeneration, lumbar region: Secondary | ICD-10-CM | POA: Diagnosis not present

## 2019-08-12 ENCOUNTER — Other Ambulatory Visit: Payer: Self-pay

## 2019-08-12 ENCOUNTER — Ambulatory Visit: Payer: Self-pay

## 2019-08-12 VITALS — BP 152/98 | HR 83 | Temp 98.0°F | Resp 19

## 2019-08-12 DIAGNOSIS — R519 Headache, unspecified: Secondary | ICD-10-CM

## 2019-08-12 MED ORDER — IBUPROFEN 200 MG PO CAPS: 400.0000 mg | ORAL_CAPSULE | Freq: Once | ORAL | Status: DC

## 2019-08-12 MED ORDER — IBUPROFEN 200 MG PO TABS
600.0000 mg | ORAL_TABLET | Freq: Once | ORAL | Status: AC
  Administered 2019-08-12: 600 mg via ORAL

## 2019-08-12 NOTE — Progress Notes (Signed)
   Subjective:    Patient ID: Donna Odonnell, female    DOB: July 01, 1976, 44 y.o.   MRN: [id number]  Presents to clinic with c/o headache 10/10. States it started last night with no relief today. She notified her PCP and they were unable to see her in office for BP check. BP noted to be elevated at this time.  Headache  This is a new problem. The current episode started yesterday. The problem occurs constantly. The problem has been unchanged. The pain is located in the frontal region. The pain does not radiate. The pain quality is similar to prior headaches. The quality of the pain is described as aching and pulsating. The pain is at a severity of 10/10. The pain is moderate. The symptoms are aggravated by noise. She has tried acetaminophen and darkened room for the symptoms. The treatment provided mild relief. Her past medical history is significant for hypertension.      Review of Systems  Neurological: Positive for headaches.       Objective:   Physical Exam  Assessment & Plan:   Wt Readings from Last 3 Encounters:  06/20/19 240 lb (108.9 kg)  04/22/19 239 lb (108.4 kg)  01/12/19 220 lb (99.8 kg)   Temp Readings from Last 3 Encounters:  08/12/19 98 F (36.7 C) (Temporal)  06/20/19 99.1 F (37.3 C) (Oral)  03/16/19 99 F (37.2 C) (Oral)   BP Readings from Last 3 Encounters:  08/12/19 (!) 152/98  06/20/19 (!) 151/97  04/22/19 130/78   Pulse Readings from Last 3 Encounters:  08/12/19 83  06/20/19 63  04/22/19 96     Lab Results  Component Value Date   POCGLU 161 (A) 04/22/2019           Thank you!!  Danise Edge RN  [place]  [facility]ational [facility] Nurse Specialist [facility] Direct Dial: [phone]  Cell:  [phone] Website: [email or web address]

## 2019-09-24 ENCOUNTER — Ambulatory Visit: Payer: BC Managed Care – PPO | Attending: Internal Medicine

## 2019-09-24 DIAGNOSIS — Z23 Encounter for immunization: Secondary | ICD-10-CM

## 2019-09-24 NOTE — Progress Notes (Signed)
   Covid-19 Vaccination Clinic  Name:  Donna Odonnell    MRN: [id number] DOB: [birth year]  09/24/2019  [name] was observed post Covid-19 immunization for 15 minutes without incident. She was provided with Vaccine Information Sheet and instruction to access the V-Safe system.   [name] was instructed to call 911 with any severe reactions post vaccine: Marland Kitchen Difficulty breathing  . Swelling of face and throat  . A fast heartbeat  . A bad rash all over body  . Dizziness and weakness   Immunizations Administered    Name Date Dose VIS Date Route   Pfizer COVID-19 Vaccine 09/24/2019 10:10 AM 0.3 mL 06/25/2019 Intramuscular   Manufacturer: [employer], [employer]   Lot: [id number]   NDC: [address]-2

## 2019-10-19 ENCOUNTER — Ambulatory Visit: Payer: BC Managed Care – PPO | Attending: Internal Medicine

## 2019-10-19 DIAGNOSIS — Z23 Encounter for immunization: Secondary | ICD-10-CM

## 2019-10-19 NOTE — Progress Notes (Signed)
   Covid-19 Vaccination Clinic  Name:  Donna Odonnell    MRN: [id number] DOB: Nov 25, 1975  10/19/2019  Donna Odonnell was observed post Covid-19 immunization for 15 minutes without incident. She was provided with Vaccine Information Sheet and instruction to access the V-Safe system.   Donna Odonnell was instructed to call 911 with any severe reactions post vaccine: Marland Kitchen Difficulty breathing  . Swelling of face and throat  . A fast heartbeat  . A bad rash all over body  . Dizziness and weakness   Immunizations Administered    Name Date Dose VIS Date Route   Pfizer COVID-19 Vaccine 10/19/2019  9:32 AM 0.3 mL 06/25/2019 Intramuscular   Manufacturer: [employer], [employer]   Lot: [id number] [id number]   NDC: [address]-4

## 2019-11-01 ENCOUNTER — Other Ambulatory Visit: Payer: Self-pay

## 2019-11-01 ENCOUNTER — Ambulatory Visit
Admission: EM | Admit: 2019-11-01 | Discharge: 2019-11-01 | Disposition: A | Discharge status: Home / Self Care | Payer: BC Managed Care – PPO | Attending: Emergency Medicine | Admitting: Emergency Medicine

## 2019-11-01 DIAGNOSIS — R109 Unspecified abdominal pain: Secondary | ICD-10-CM

## 2019-11-01 DIAGNOSIS — R197 Diarrhea, unspecified: Secondary | ICD-10-CM

## 2019-11-01 MED ORDER — ONDANSETRON 8 MG PO TBDP: ORAL_TABLET | ORAL | 0 refills | Status: DC

## 2019-11-01 MED ORDER — IBUPROFEN 600 MG PO TABS: 600.0000 mg | ORAL_TABLET | Freq: Four times a day (QID) | ORAL | 0 refills | Status: DC | PRN

## 2019-11-01 NOTE — Discharge Instructions (Addendum)
Zofran will help with nausea and will also make you constipated so will slow down the diarrhea.  Push electrolyte containing fluids such as Pedialyte or Gatorade until yoururine is clear.  May take 600 mg of ibuprofen combined with 1000 mg of Tylenol 3-4 times a day as needed for pain.

## 2019-11-01 NOTE — ED Triage Notes (Addendum)
Pt states she woke yesterday with diarrhea and stomach cramps. Stomach feels "yucky"  Has mild headache. No vomiting. Pt had COVID in December and is fully vaccinated against COVID as of April 6th

## 2019-11-01 NOTE — ED Provider Notes (Signed)
HPI  SUBJECTIVE:  Donna Odonnell is a 44 y.o. female who presents with the acute onset of multiple episodes of lower abdominal cramping followed by watery, nonbloody diarrhea and nausea.  This started at 5 AM yesterday.  Has had 5 episodes of diarrhea total.  No vomiting. She reports a pounding headache yesterday, but states that it is "a lot better today".  States that the abdominal pain is becoming less intense and frequent, and the diarrhea is slowing down.  Had a cookout the night before where she ate some coleslaw hotdog and hamburger, but nobody else at the cookout got sick.  She reports decreased appetite but is tolerating p.o.  No fevers, body aches.  No abdominal distention.  No urinary, vaginal complaints.  No raw or undercooked foods, questionable leftovers, sick contacts.  No recent travel, recent antibiotics.  No change in urine output.  No dizziness lightheadedness increased thirst palpitations.  States that she feels fatigued, "wiped out".  She has tried eating bland foods and took ibuprofen 600 mg once without improvement.  States her nausea and abdominal pain is better with sitting still and after having a bowel movement.  States that the nausea and abdominal pain gets worse with movement and before having a bowel movement.  States the car ride over here made her feel nauseous, but was not painful.  The abdominal pain is not associated with eating.  No antipyretic in the past 4 to 6 hours.  She has a past medical history of Covid and  has received her second Covid vaccine.  She has a remote history of 1 urinary tract infection.  She does not drink.  She has had 3 C-sections, and has a history of hypertension.  No history of diabetes, diverticulitis, ulcerative colitis, Crohn's disease atrial fibrillation mesenteric ischemia hypercoagulability, C. difficile infection.  LMP: 4/1.  Denies the possibility of being pregnant.  GYF:VCBSW, Harriett P, MD    History reviewed. No pertinent past  medical history.  Past Surgical History:  Procedure Laterality Date  . CESAREAN SECTION    . TONSILLECTOMY    . TUBAL LIGATION      Family History  Problem Relation Age of Onset  . Cancer Mother   . COPD Mother   . Breast cancer Mother 43  . Breast cancer Maternal Grandmother 50    Social History   Tobacco Use  . Smoking status: Current Every Day Smoker    Packs/day: 0.50    Types: Cigarettes  . Smokeless tobacco: Never Used  Substance Use Topics  . Alcohol use: No  . Drug use: No    No current facility-administered medications for this encounter.  Current Outpatient Medications:  .  hydrochlorothiazide (HYDRODIURIL) 25 MG tablet, Take 25 mg by mouth daily. for high blood pressure, Disp: , Rfl:  .  ibuprofen (ADVIL) 600 MG tablet, Take 1 tablet (600 mg total) by mouth every 6 (six) hours as needed., Disp: 30 tablet, Rfl: 0 .  ondansetron (ZOFRAN ODT) 8 MG disintegrating tablet, 1/2- 1 tablet q 8 hr prn nausea, vomiting, Disp: 20 tablet, Rfl: 0  Allergies  Allergen Reactions  . Ciprofloxacin Hives     ROS  As noted in HPI.   Physical Exam  BP 128/81 (BP Location: Right Arm)   Pulse 84   Temp 98.5 F (36.9 C) (Oral)   Resp 16   Ht 5\' 5"  (1.651 m)   Wt 115.7 kg   LMP 10/14/2019   SpO2 100%   BMI  42.43 kg/m   Constitutional: Well developed, well nourished, no acute distress Eyes:  EOMI, conjunctiva normal bilaterally HENT: Normocephalic, atraumatic,mucus membranes moist Respiratory: Normal inspiratory effort Cardiovascular: Normal rate regular rhythm no murmurs rubs or gallops.  Cap refill less than 2 seconds GI: Positive transverse low C section scar otherwise normal appearance.  Active bowel sounds.  No distention.  No tenderness.  No guarding, rebound.  Negative Murphy negative McBurney.  Tap table test negative. Back: No CVAT skin: No rash, skin intact Musculoskeletal: no deformities Neurologic: Alert & oriented x 3, no focal neuro  deficits Psychiatric: Speech and behavior appropriate   ED Course   Medications - No data to display  No orders of the defined types were placed in this encounter.   No results found for this or any previous visit (from the past 24 hour(s)). No results found.  ED Clinical Impression  1. Diarrhea of presumed infectious origin   2. Abdominal cramping      ED Assessment/Plan  Abdomen benign.  Patient is not dehydrated.  She is tolerating p.o. patient reporting that her symptoms are improving.  Suspect a viral diarrheal illness.  Doubt Covid.  Doubt bacterial infection.   will have her push fluids, Zofran, ibuprofen 600 mg combined with 1000 g of Tylenol 3-4 times a day as needed for pain.  Follow-up with her primary care physician as needed or may return here if not better in a week and we can do stool sample testing.  Strict ER return precautions.  Discussed  MDM, treatment plan, and plan for follow-up with patient. Discussed sn/sx that should prompt return to the ED. patient agrees with plan.   Meds ordered this encounter  Medications  . ondansetron (ZOFRAN ODT) 8 MG disintegrating tablet    Sig: 1/2- 1 tablet q 8 hr prn nausea, vomiting    Dispense:  20 tablet    Refill:  0  . ibuprofen (ADVIL) 600 MG tablet    Sig: Take 1 tablet (600 mg total) by mouth every 6 (six) hours as needed.    Dispense:  30 tablet    Refill:  0    *This clinic note was created using [employer]. Therefore, there may be occasional mistakes despite careful proofreading.   ?    Melynda Ripple, MD 11/01/19 1844

## 2020-01-10 ENCOUNTER — Encounter: Payer: Self-pay | Admitting: Emergency Medicine

## 2020-01-10 ENCOUNTER — Other Ambulatory Visit: Payer: Self-pay

## 2020-01-10 ENCOUNTER — Ambulatory Visit
Admission: EM | Admit: 2020-01-10 | Discharge: 2020-01-10 | Disposition: A | Discharge status: Home / Self Care | Payer: BC Managed Care – PPO | Attending: Family Medicine | Admitting: Family Medicine

## 2020-01-10 DIAGNOSIS — M546 Pain in thoracic spine: Secondary | ICD-10-CM

## 2020-01-10 HISTORY — DX: Essential (primary) hypertension: I10

## 2020-01-10 MED ORDER — KETOROLAC TROMETHAMINE 10 MG PO TABS: 10.0000 mg | ORAL_TABLET | Freq: Four times a day (QID) | ORAL | 0 refills | Status: DC | PRN

## 2020-01-10 MED ORDER — TIZANIDINE HCL 4 MG PO TABS: 4.0000 mg | ORAL_TABLET | Freq: Four times a day (QID) | ORAL | 0 refills | Status: DC | PRN

## 2020-01-10 NOTE — ED Triage Notes (Signed)
Pt c/o mid back pain. Started yesterday. She states it hurts to take a deep breath. No known injury that she can recall. She has taken Ibuprofen without much relief.

## 2020-01-10 NOTE — ED Provider Notes (Signed)
[facility]    CSN: [id number] Arrival date & time: 01/10/20  1219 History   Chief Complaint Chief Complaint  Patient presents with   Back Pain   HPI  44 year old female presents with back pain.  Patient reports right-sided thoracic back pain around the bra line.  Started yesterday.  Patient does not recall any injury or trauma.  Patient states that her pain is worse with certain movements.  Also worse when she takes a deep breath.  She has taken ibuprofen without relief.  No other associated symptoms.  No other complaints.  Past Medical History:  Diagnosis Date   Hypertension    Past Surgical History:  Procedure Laterality Date   CESAREAN SECTION     TONSILLECTOMY     TUBAL LIGATION      OB History    Gravida  3   Para      Term      Preterm      AB      Living  3     SAB      TAB      Ectopic      Multiple      Live Births               Home Medications    Prior to Admission medications   Medication Sig Start Date End Date Taking? Authorizing Provider  hydrochlorothiazide (HYDRODIURIL) 25 MG tablet Take 25 mg by mouth daily. for high blood pressure 12/15/18  Yes [provider]  ketorolac (TORADOL) 10 MG tablet Take 1 tablet (10 mg total) by mouth every 6 (six) hours as needed for moderate pain or severe pain. 01/10/20   Coral Spikes, DO  tiZANidine (ZANAFLEX) 4 MG tablet Take 1 tablet (4 mg total) by mouth every 6 (six) hours as needed for muscle spasms. 01/10/20   Coral Spikes, DO    Family History Family History  Problem Relation Age of Onset   Cancer Mother    COPD Mother    Breast cancer Mother 18   Breast cancer Maternal Grandmother 38    Social History Social History   Tobacco Use   Smoking status: Current Every Day Smoker    Packs/day: 0.50    Types: Cigarettes   Smokeless tobacco: Never Used  Vaping Use   Vaping Use: Never used  Substance Use Topics   Alcohol use: No   Drug use: No      Allergies   Ciprofloxacin   Review of Systems Review of Systems  Constitutional: Negative.   Musculoskeletal: Positive for back pain.   Physical Exam Triage Vital Signs ED Triage Vitals  Enc Vitals Group     BP 01/10/20 1328 131/90     Pulse Rate 01/10/20 1328 74     Resp 01/10/20 1328 18     Temp 01/10/20 1328 98.4 F (36.9 C)     Temp Source 01/10/20 1328 Oral     SpO2 01/10/20 1328 98 %     Weight 01/10/20 1325 255 lb 1.2 oz (115.7 kg)     Height 01/10/20 1325 5\' 5"  (1.651 m)     Head Circumference --      Peak Flow --      Pain Score 01/10/20 1325 7     Pain Loc --      Pain Edu? --      Excl. in [place]? --    Updated Vital Signs BP 131/90 (BP Location: Right Arm)  Pulse 74    Temp 98.4 F (36.9 C) (Oral)    Resp 18    Ht 5\' 5"  (1.651 m)    Wt 115.7 kg    LMP 12/31/2019    SpO2 98%    BMI 42.45 kg/m   Visual Acuity Right Eye Distance:   Left Eye Distance:   Bilateral Distance:    Right Eye Near:   Left Eye Near:    Bilateral Near:     Physical Exam Vitals and nursing note reviewed.  Constitutional:      General: She is not in acute distress.    Appearance: Normal appearance. She is not ill-appearing.  HENT:     Head: Normocephalic and atraumatic.  Eyes:     General:        Right eye: No discharge.        Left eye: No discharge.     Conjunctiva/sclera: Conjunctivae normal.  Cardiovascular:     Rate and Rhythm: Normal rate and regular rhythm.     Heart sounds: No murmur heard.   Pulmonary:     Effort: Pulmonary effort is normal.     Breath sounds: Normal breath sounds. No wheezing, rhonchi or rales.  Musculoskeletal:       Arms:     Comments: Tenderness at the labelled location.  Neurological:     Mental Status: She is alert.  Psychiatric:        Mood and Affect: Mood normal.        Behavior: Behavior normal.    UC Treatments / Results  Labs (all labs ordered are listed, but only abnormal results are displayed) Labs Reviewed - No  data to display  EKG   Radiology No results found.  Procedures Procedures (including critical care time)  Medications Ordered in UC Medications - No data to display  Initial Impression / Assessment and Plan / UC Course  I have reviewed the triage vital signs and the nursing notes.  Pertinent labs & imaging results that were available during my care of the patient were reviewed by me and considered in my medical decision making (see chart for details).    44 year old female presents with right-sided thoracic back pain.  Treating with Toradol and Zanaflex.  Work note given.  Supportive care.  Final Clinical Impressions(s) / UC Diagnoses   Final diagnoses:  Acute right-sided thoracic back pain   Discharge Instructions   None    ED Prescriptions    Medication Sig Dispense Auth. Provider   ketorolac (TORADOL) 10 MG tablet Take 1 tablet (10 mg total) by mouth every 6 (six) hours as needed for moderate pain or severe pain. 20 tablet Marc Leichter G, DO   tiZANidine (ZANAFLEX) 4 MG tablet Take 1 tablet (4 mg total) by mouth every 6 (six) hours as needed for muscle spasms. 30 tablet 08-19-2003, DO     PDMP not reviewed this encounter.   Tommie Sams, Tommie Sams 01/10/20 1434

## 2020-03-29 DIAGNOSIS — Z20822 Contact with and (suspected) exposure to covid-19: Secondary | ICD-10-CM | POA: Diagnosis not present

## 2020-04-24 ENCOUNTER — Other Ambulatory Visit: Payer: Self-pay | Admitting: Internal Medicine

## 2020-05-02 ENCOUNTER — Other Ambulatory Visit: Payer: Self-pay | Admitting: Internal Medicine

## 2020-05-15 ENCOUNTER — Other Ambulatory Visit: Payer: Self-pay | Admitting: Internal Medicine

## 2020-05-15 DIAGNOSIS — Z7189 Other specified counseling: Secondary | ICD-10-CM | POA: Diagnosis not present

## 2020-05-15 DIAGNOSIS — Z0000 Encounter for general adult medical examination without abnormal findings: Secondary | ICD-10-CM | POA: Diagnosis not present

## 2020-05-15 DIAGNOSIS — I10 Essential (primary) hypertension: Secondary | ICD-10-CM | POA: Diagnosis not present

## 2020-05-15 DIAGNOSIS — N761 Subacute and chronic vaginitis: Secondary | ICD-10-CM | POA: Diagnosis not present

## 2020-05-15 DIAGNOSIS — Z1231 Encounter for screening mammogram for malignant neoplasm of breast: Secondary | ICD-10-CM

## 2020-05-15 DIAGNOSIS — N888 Other specified noninflammatory disorders of cervix uteri: Secondary | ICD-10-CM | POA: Diagnosis not present

## 2020-05-24 ENCOUNTER — Other Ambulatory Visit: Payer: Self-pay

## 2020-05-24 ENCOUNTER — Ambulatory Visit
Admission: RE | Admit: 2020-05-24 | Discharge: 2020-05-24 | Disposition: A | Discharge status: Home / Self Care | Payer: BC Managed Care – PPO | Source: Ambulatory Visit | Attending: Emergency Medicine | Admitting: Emergency Medicine

## 2020-05-24 VITALS — BP 113/79 | HR 86 | Temp 99.2°F | Resp 16 | Ht 65.0 in | Wt 225.0 lb

## 2020-05-24 DIAGNOSIS — J029 Acute pharyngitis, unspecified: Secondary | ICD-10-CM | POA: Diagnosis not present

## 2020-05-24 LAB — POCT RAPID STREP A (OFFICE): Rapid Strep A Screen: NEGATIVE

## 2020-05-24 NOTE — Discharge Instructions (Signed)
Your rapid strep test is negative.  A throat culture is pending; we will call you if it is positive requiring treatment.    Follow up with your primary care provider if your symptoms are not improving.    

## 2020-05-24 NOTE — ED Triage Notes (Signed)
Patient in [facility] today c/o sorethroat x 2d hurts to eat and drink  OTC: [place]

## 2020-05-24 NOTE — ED Provider Notes (Signed)
Donna Odonnell    CSN: [id number] Arrival date & time: 05/24/20  1447      History   Chief Complaint Chief Complaint  Patient presents with  . Sore Throat    HPI Donna Odonnell is a 44 y.o. female.   Patient presents with 2-day history of sore throat.  She denies fever, rash, cough, shortness of breath, vomiting, diarrhea, or other symptoms.  Treatment attempted at home with OTC lozenges.  Her medical history includes hypertension.  The history is provided by the patient.    Past Medical History:  Diagnosis Date  . Hypertension     There are no problems to display for this patient.   Past Surgical History:  Procedure Laterality Date  . CESAREAN SECTION    . TONSILLECTOMY    . TUBAL LIGATION      OB History    Gravida  3   Para      Term      Preterm      AB      Living  3     SAB      TAB      Ectopic      Multiple      Live Births               Home Medications    Prior to Admission medications   Medication Sig Start Date End Date Taking? Authorizing Provider  hydrochlorothiazide (HYDRODIURIL) 25 MG tablet Take 25 mg by mouth daily. for high blood pressure 12/15/18   [provider]  ketorolac (TORADOL) 10 MG tablet Take 1 tablet (10 mg total) by mouth every 6 (six) hours as needed for moderate pain or severe pain. 01/10/20   Tommie Sams, DO  tiZANidine (ZANAFLEX) 4 MG tablet Take 1 tablet (4 mg total) by mouth every 6 (six) hours as needed for muscle spasms. 01/10/20   Tommie Sams, DO    Family History Family History  Problem Relation Age of Onset  . Cancer Mother   . COPD Mother   . Breast cancer Mother 35  . Breast cancer Maternal Grandmother 41    Social History Social History   Tobacco Use  . Smoking status: Current Every Day Smoker    Packs/day: 0.50    Types: Cigarettes  . Smokeless tobacco: Never Used  Vaping Use  . Vaping Use: Never used  Substance Use Topics  . Alcohol use: No  . Drug use: No       Allergies   Ciprofloxacin   Review of Systems Review of Systems  Constitutional: Negative for chills and fever.  HENT: Positive for sore throat. Negative for ear pain.   Eyes: Negative for pain and visual disturbance.  Respiratory: Negative for cough and shortness of breath.   Cardiovascular: Negative for chest pain and palpitations.  Gastrointestinal: Negative for abdominal pain and vomiting.  Genitourinary: Negative for dysuria and hematuria.  Musculoskeletal: Negative for arthralgias and back pain.  Skin: Negative for color change and rash.  Neurological: Negative for seizures and syncope.  All other systems reviewed and are negative.    Physical Exam Triage Vital Signs ED Triage Vitals  Enc Vitals Group     BP      Pulse      Resp      Temp      Temp src      SpO2      Weight      Height  Head Circumference      Peak Flow      Pain Score      Pain Loc      Pain Edu?      Excl. in [place]?    No data found.  Updated Vital Signs BP 113/79 (BP Location: Right Arm)   Pulse 86   Temp 99.2 F (37.3 C) (Oral)   Resp 16   Ht 5\' 5"  (1.651 m)   Wt 225 lb (102.1 kg)   LMP 05/09/2020   SpO2 97%   BMI 37.44 kg/m   Visual Acuity Right Eye Distance:   Left Eye Distance:   Bilateral Distance:    Right Eye Near:   Left Eye Near:    Bilateral Near:     Physical Exam Vitals and nursing note reviewed.  Constitutional:      General: She is not in acute distress.    Appearance: She is well-developed. She is not ill-appearing.  HENT:     Head: Normocephalic and atraumatic.     Right Ear: Tympanic membrane normal.     Left Ear: Tympanic membrane normal.     Nose: Nose normal.     Mouth/Throat:     Mouth: Mucous membranes are moist.     Pharynx: Oropharynx is clear.  Eyes:     Conjunctiva/sclera: Conjunctivae normal.  Cardiovascular:     Rate and Rhythm: Normal rate and regular rhythm.     Heart sounds: Normal heart sounds.  Pulmonary:     Effort:  Pulmonary effort is normal. No respiratory distress.     Breath sounds: Normal breath sounds.  Abdominal:     Palpations: Abdomen is soft.     Tenderness: There is no abdominal tenderness. There is no guarding or rebound.  Musculoskeletal:     Cervical back: Neck supple.  Skin:    General: Skin is warm and dry.     Findings: No rash.  Neurological:     General: No focal deficit present.     Mental Status: She is alert and oriented to person, place, and time.     Gait: Gait normal.  Psychiatric:        Mood and Affect: Mood normal.        Behavior: Behavior normal.      UC Treatments / Results  Labs (all labs ordered are listed, but only abnormal results are displayed) Labs Reviewed  CULTURE, GROUP A STREP [facility])  POCT RAPID STREP A (OFFICE)    EKG   Radiology No results found.  Procedures Procedures (including critical care time)  Medications Ordered in UC Medications - No data to display  Initial Impression / Assessment and Plan / UC Course  I have reviewed the triage vital signs and the nursing notes.  Pertinent labs & imaging results that were available during my care of the patient were reviewed by me and considered in my medical decision making (see chart for details).   Sore throat.  Rapid strep negative; culture pending.  Symptomatic treatment discussed.  Instructed patient to follow-up with her PCP if her symptoms are not improving.  Patient agrees to plan of care.   Final Clinical Impressions(s) / UC Diagnoses   Final diagnoses:  Sore throat     Discharge Instructions     Your rapid strep test is negative.  A throat culture is pending; we will call you if it is positive requiring treatment.    Follow up with your primary care provider if your  symptoms are not improving.        ED Prescriptions    None     PDMP not reviewed this encounter.   Mickie Bail, NP 05/24/20 [phone]

## 2020-05-27 LAB — CULTURE, GROUP A STREP (THRC)

## 2020-06-15 ENCOUNTER — Other Ambulatory Visit: Payer: Self-pay

## 2020-06-15 ENCOUNTER — Ambulatory Visit
Admission: RE | Admit: 2020-06-15 | Discharge: 2020-06-15 | Disposition: A | Discharge status: Home / Self Care | Payer: BC Managed Care – PPO | Source: Ambulatory Visit | Attending: Internal Medicine | Admitting: Internal Medicine

## 2020-06-15 DIAGNOSIS — Z1231 Encounter for screening mammogram for malignant neoplasm of breast: Secondary | ICD-10-CM

## 2020-06-22 DIAGNOSIS — Z113 Encounter for screening for infections with a predominantly sexual mode of transmission: Secondary | ICD-10-CM | POA: Diagnosis not present

## 2020-06-22 DIAGNOSIS — A499 Bacterial infection, unspecified: Secondary | ICD-10-CM | POA: Diagnosis not present

## 2021-01-08 ENCOUNTER — Ambulatory Visit: Payer: Self-pay | Admitting: Internal Medicine

## 2021-01-25 DIAGNOSIS — M7552 Bursitis of left shoulder: Secondary | ICD-10-CM | POA: Diagnosis not present

## 2021-01-25 DIAGNOSIS — S46912A Strain of unspecified muscle, fascia and tendon at shoulder and upper arm level, left arm, initial encounter: Secondary | ICD-10-CM | POA: Diagnosis not present

## 2021-02-05 ENCOUNTER — Ambulatory Visit
Admission: RE | Admit: 2021-02-05 | Discharge: 2021-02-05 | Disposition: A | Discharge status: Home / Self Care | Payer: BC Managed Care – PPO | Source: Ambulatory Visit | Attending: Internal Medicine | Admitting: Internal Medicine

## 2021-02-05 ENCOUNTER — Ambulatory Visit
Admission: RE | Admit: 2021-02-05 | Discharge: 2021-02-05 | Disposition: A | Discharge status: Home / Self Care | Payer: BC Managed Care – PPO | Attending: Internal Medicine | Admitting: Internal Medicine

## 2021-02-05 ENCOUNTER — Ambulatory Visit: Payer: BC Managed Care – PPO | Admitting: Internal Medicine

## 2021-02-05 ENCOUNTER — Encounter: Payer: Self-pay | Admitting: Internal Medicine

## 2021-02-05 ENCOUNTER — Other Ambulatory Visit: Payer: Self-pay

## 2021-02-05 VITALS — BP 120/76 | HR 84 | Temp 99.2°F | Ht 64.57 in | Wt 219.4 lb

## 2021-02-05 DIAGNOSIS — R21 Rash and other nonspecific skin eruption: Secondary | ICD-10-CM | POA: Diagnosis not present

## 2021-02-05 DIAGNOSIS — M25512 Pain in left shoulder: Secondary | ICD-10-CM | POA: Diagnosis not present

## 2021-02-05 DIAGNOSIS — I10 Essential (primary) hypertension: Secondary | ICD-10-CM | POA: Diagnosis not present

## 2021-02-05 LAB — URINALYSIS, ROUTINE W REFLEX MICROSCOPIC
Bilirubin, UA: NEGATIVE
Glucose, UA: NEGATIVE
Ketones, UA: NEGATIVE
Leukocytes,UA: NEGATIVE
Nitrite, UA: NEGATIVE
Specific Gravity, UA: >1.03 — ABNORMAL HIGH (ref 1.005–1.030)
Urobilinogen, Ur: 0.2 mg/dL (ref 0.2–1.0)
pH, UA: 5.5 (ref 5.0–7.5)

## 2021-02-05 LAB — MICROSCOPIC EXAMINATION: WBC, UA: NONE SEEN /hpf (ref 0–5)

## 2021-02-05 LAB — BAYER DCA HB A1C WAIVED: HB A1C (BAYER DCA - WAIVED): 5.5 % (ref <7.0)

## 2021-02-05 MED ORDER — FLUCONAZOLE 150 MG PO TABS: 150.0000 mg | ORAL_TABLET | Freq: Once | ORAL | 1 refills | Status: AC

## 2021-02-05 NOTE — Progress Notes (Signed)
BP 120/76   Pulse 84   Temp 99.2 F (37.3 C) (Oral)   Ht 5' 4.57" (1.64 m)   Wt 219 lb 6.4 oz (99.5 kg)   LMP 01/18/2021   SpO2 99%   BMI 37.00 kg/m    Subjective:    Patient ID: Donna Odonnell, female    DOB: 31-Dec-1975, 45 y.o.   MRN: [id number]  Chief Complaint  Patient presents with  . New Patient (Initial Visit)  . Rash    For the past 2 to 3 months she get rash before and after menstrual cycle in the creases of her legs in the groin area.   . Hypertension  . Bursitis    Left shoulder    HPI: Donna Odonnell is a 45 y.o. female  Pt is here to establush care, is new to the practice. Shehas a pmh of htn s on hctz 25 mg daily And for the following cc:   Patient presents with: New Patient (Initial Visit) Rash: For the past 2 to 3 months she get rash before and after menstrual cycle in the creases of her legs in the groin area.  Hypertension Bursitis: Left shoulder   Rash This is a new (has an itchy rash on her groin, only during / before or after periods. 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 Progression since onset: has polydypsia, throat gets dry, no polyuria or polyphagia. FH +ve grandma and grand dad had DM. Pertinent negatives include no rhinorrhea or shortness of breath.  Hypertension This is a chronic problem. The current episode started more than 1 year ago. The problem has been gradually improving since onset. The problem is controlled. Pertinent negatives include no anxiety, blurred vision, chest pain, headaches, malaise/fatigue, neck pain, orthopnea, palpitations, peripheral edema, PND, shortness of breath or sweats.   Chief Complaint  Patient presents with  . New Patient (Initial Visit)  . Rash    For the past 2 to 3 months she get rash before and after menstrual cycle in the creases of her legs in the groin area.   . Hypertension  . Bursitis    Left shoulder    Relevant past medical, surgical, family and social history  reviewed and updated as indicated. Interim medical history since our last visit reviewed. Allergies and medications reviewed and updated.  Review of Systems  Constitutional:  Negative for malaise/fatigue.  HENT:  Negative for rhinorrhea.   Eyes:  Negative for blurred vision.  Respiratory:  Negative for shortness of breath.   Cardiovascular:  Negative for chest pain, palpitations, orthopnea and PND.  Musculoskeletal:  Negative for neck pain.  Skin:  Positive for rash.  Neurological:  Negative for headaches.   Per HPI unless specifically indicated above     Objective:    BP 120/76   Pulse 84   Temp 99.2 F (37.3 C) (Oral)   Ht 5' 4.57" (1.64 m)   Wt 219 lb 6.4 oz (99.5 kg)   LMP 01/18/2021   SpO2 99%   BMI 37.00 kg/m   Wt Readings from Last 3 Encounters:  02/05/21 219 lb 6.4 oz (99.5 kg)  05/24/20 225 lb (102.1 kg)  01/10/20 255 lb 1.2 oz (115.7 kg)    Physical Exam Vitals and nursing note reviewed. Exam conducted with a chaperone present.  Constitutional:      General: She is not in acute distress.    Appearance: Normal appearance. She is not ill-appearing or diaphoretic.  Eyes:  Conjunctiva/sclera: Conjunctivae normal.  Pulmonary:     Breath sounds: No rhonchi.  Abdominal:     General: Abdomen is flat. Bowel sounds are normal. There is no distension.     Palpations: Abdomen is soft. There is no mass.     Tenderness: There is no abdominal tenderness. There is no guarding.  Genitourinary:    Labia:        Right: No rash, tenderness or lesion.        Left: No rash, tenderness or lesion.      Comments: Vaginal discharge noted.  Skin:    General: Skin is warm and dry.     Coloration: Skin is not jaundiced.     Findings: No erythema.  Neurological:     Mental Status: She is alert.    Results for orders placed or performed during the hospital encounter of 05/24/20  Culture, group A strep   Specimen: Throat  Result Value Ref Range   Specimen Description  THROAT    Special Requests NONE    Culture      NO GROUP A STREP (S.PYOGENES) ISOLATED Performed at [facility], [address] [place], [place] [address]    Report Status 05/27/2020 FINAL   POCT rapid strep A  Result Value Ref Range   Rapid Strep A Screen Negative Negative        Current Outpatient Medications:  .  hydrochlorothiazide (HYDRODIURIL) 25 MG tablet, Take 25 mg by mouth daily. for high blood pressure, Disp: , Rfl:  .  tiZANidine (ZANAFLEX) 4 MG tablet, Take 1 tablet (4 mg total) by mouth every 6 (six) hours as needed for muscle spasms., Disp: 30 tablet, Rfl: 0 .  meloxicam (MOBIC) 15 MG tablet, meloxicam 15 mg tablet  Take 1 tablet every day by oral route with meals., Disp: , Rfl:     Assessment & Plan:  Left shoulder pain will check xryas of such  ? Tenditnitis  Willneed NSDAIs and exercise  Consider PT   HTN is on hct and 25 mg for such  Continue current meds.  Medication compliance emphasised. pt advised to keep Bp logs. Pt verbalised understanding of the same. Pt to have a low salt diet . Exercise to reach a goal of at least 150 mins a week.  lifestyle modifications explained and pt understands importance of the above.   Discharge vaginal / in groin area: ? Sec to Vaginal candidiasis will send off diflucan  Patient's last menstrual period was 01/18/2021.   Problem List Items Addressed This Visit   None    Orders Placed This Encounter  Procedures  . Microscopic Examination  . DG Shoulder Left  . Bayer DCA Hb A1c Waived  . CBC with Differential/Platelet  . Comprehensive metabolic panel  . Lipid panel  . TSH  . Urinalysis, Routine w reflex microscopic     Meds ordered this encounter  Medications  . fluconazole (DIFLUCAN) 150 MG tablet    Sig: Take 1 tablet (150 mg total) by mouth once for 1 dose.    Dispense:  3 tablet    Refill:  1     Follow up plan: No follow-ups on file.

## 2021-02-05 NOTE — Progress Notes (Signed)
Please let pt know this was normal.

## 2021-02-06 LAB — COMPREHENSIVE METABOLIC PANEL
ALT: 8 IU/L (ref 0–32)
AST: 9 IU/L (ref 0–40)
Albumin/Globulin Ratio: 1.4 (ref 1.2–2.2)
Albumin: 3.8 g/dL (ref 3.8–4.8)
Alkaline Phosphatase: 47 IU/L (ref 44–121)
BUN/Creatinine Ratio: 19 (ref 9–23)
BUN: 14 mg/dL (ref 6–24)
Bilirubin Total: 0.2 mg/dL (ref 0.0–1.2)
CO2: 22 mmol/L (ref 20–29)
Calcium: 8.8 mg/dL (ref 8.7–10.2)
Chloride: 106 mmol/L (ref 96–106)
Creatinine, Ser: 0.75 mg/dL (ref 0.57–1.00)
Globulin, Total: 2.7 g/dL (ref 1.5–4.5)
Glucose: 75 mg/dL (ref 65–99)
Potassium: 3.8 mmol/L (ref 3.5–5.2)
Sodium: 139 mmol/L (ref 134–144)
Total Protein: 6.5 g/dL (ref 6.0–8.5)
eGFR: 102 mL/min/{1.73_m2} (ref >59)

## 2021-02-06 LAB — CBC WITH DIFFERENTIAL/PLATELET
Basophils Absolute: 0 10*3/uL (ref 0.0–0.2)
Basos: 1 %
EOS (ABSOLUTE): 0.1 10*3/uL (ref 0.0–0.4)
Eos: 2 %
Hematocrit: 36.3 % (ref 34.0–46.6)
Hemoglobin: 11.7 g/dL (ref 11.1–15.9)
Immature Grans (Abs): 0 10*3/uL (ref 0.0–0.1)
Immature Granulocytes: 0 %
Lymphocytes Absolute: 2.4 10*3/uL (ref 0.7–3.1)
Lymphs: 40 %
MCH: 27.6 pg (ref 26.6–33.0)
MCHC: 32.2 g/dL (ref 31.5–35.7)
MCV: 86 fL (ref 79–97)
Monocytes Absolute: 0.5 10*3/uL (ref 0.1–0.9)
Monocytes: 9 %
Neutrophils Absolute: 2.9 10*3/uL (ref 1.4–7.0)
Neutrophils: 48 %
Platelets: 261 10*3/uL (ref 150–450)
RBC: 4.24 x10E6/uL (ref 3.77–5.28)
RDW: 14.3 % (ref 11.7–15.4)
WBC: 6 10*3/uL (ref 3.4–10.8)

## 2021-02-06 LAB — LIPID PANEL
Chol/HDL Ratio: 3.1 ratio (ref 0.0–4.4)
Cholesterol, Total: 176 mg/dL (ref 100–199)
HDL: 56 mg/dL (ref >39)
LDL Chol Calc (NIH): 105 mg/dL — ABNORMAL HIGH (ref 0–99)
Triglycerides: 80 mg/dL (ref 0–149)
VLDL Cholesterol Cal: 15 mg/dL (ref 5–40)

## 2021-02-06 LAB — TSH: TSH: 1.79 u[IU]/mL (ref 0.450–4.500)

## 2021-02-19 ENCOUNTER — Other Ambulatory Visit: Payer: Self-pay

## 2021-02-19 ENCOUNTER — Encounter: Payer: Self-pay | Admitting: Internal Medicine

## 2021-02-19 ENCOUNTER — Ambulatory Visit (INDEPENDENT_AMBULATORY_CARE_PROVIDER_SITE_OTHER): Payer: BC Managed Care – PPO | Admitting: Internal Medicine

## 2021-02-19 VITALS — BP 130/90 | HR 87 | Temp 98.3°F | Ht 64.57 in | Wt 215.8 lb

## 2021-02-19 DIAGNOSIS — Z124 Encounter for screening for malignant neoplasm of cervix: Secondary | ICD-10-CM

## 2021-02-19 DIAGNOSIS — M25512 Pain in left shoulder: Secondary | ICD-10-CM

## 2021-02-19 DIAGNOSIS — Z0000 Encounter for general adult medical examination without abnormal findings: Secondary | ICD-10-CM

## 2021-02-19 NOTE — Progress Notes (Signed)
BP 130/90   Pulse 87   Temp 98.3 F (36.8 C) (Oral)   Ht 5' 4.57" (1.64 m)   Wt 215 lb 12.8 oz (97.9 kg)   SpO2 99%   BMI 36.39 kg/m    Subjective:    Patient ID: Donna Odonnell, female    DOB: 11-04-1975, 45 y.o.   MRN: [id number]  Chief Complaint  Patient presents with   Hypertension   review xray results    Left shoulder pain, patient states that the shoulder is still painful   Review lab work    HPI: Donna Odonnell is a 45 y.o. female  Hypertension This is a chronic problem. The current episode started more than 1 year ago. The problem has been gradually improving since onset. The problem is controlled. Associated symptoms include headaches. Pertinent negatives include no anxiety, blurred vision, chest pain, malaise/fatigue, neck pain, orthopnea, palpitations, peripheral edema, PND, shortness of breath or sweats.  Hip Pain  The incident occurred more than 1 week ago. The quality of the pain is described as shooting. The pain is at a severity of 6/10. The pain is moderate. The pain has been Improving since onset. Pertinent negatives include no inability to bear weight, loss of motion, loss of sensation, muscle weakness, numbness or tingling.  Shoulder Pain  This is a chronic problem. The current episode started more than 1 year ago. The problem has been gradually improving. Pertinent negatives include no inability to bear weight, numbness or tingling. The treatment provided mild relief. Her past medical history is significant for osteoarthritis.   Chief Complaint  Patient presents with   Hypertension   review xray results    Left shoulder pain, patient states that the shoulder is still painful   Review lab work    Relevant past medical, surgical, family and social history reviewed and updated as indicated. Interim medical history since our last visit reviewed. Allergies and medications reviewed and updated.  Review of Systems  Constitutional:  Negative for  malaise/fatigue.  Eyes:  Negative for blurred vision.  Respiratory:  Negative for shortness of breath.   Cardiovascular:  Negative for chest pain, palpitations, orthopnea and PND.  Musculoskeletal:  Negative for neck pain.  Neurological:  Positive for headaches. Negative for tingling and numbness.   Per HPI unless specifically indicated above     Objective:    BP 130/90   Pulse 87   Temp 98.3 F (36.8 C) (Oral)   Ht 5' 4.57" (1.64 m)   Wt 215 lb 12.8 oz (97.9 kg)   SpO2 99%   BMI 36.39 kg/m   Wt Readings from Last 3 Encounters:  02/19/21 215 lb 12.8 oz (97.9 kg)  02/05/21 219 lb 6.4 oz (99.5 kg)  05/24/20 225 lb (102.1 kg)    Physical Exam Vitals and nursing note reviewed.  Constitutional:      General: She is not in acute distress.    Appearance: Normal appearance. She is not ill-appearing or diaphoretic.  Eyes:     Conjunctiva/sclera: Conjunctivae normal.  Pulmonary:     Breath sounds: No rhonchi.  Abdominal:     General: Abdomen is flat. Bowel sounds are normal. There is no distension.     Palpations: Abdomen is soft. There is no mass.     Tenderness: There is no abdominal tenderness. There is no guarding.  Skin:    General: Skin is warm and dry.     Coloration: Skin is not jaundiced.  Findings: No erythema.  Neurological:     Mental Status: She is alert.    Results for orders placed or performed in visit on 02/05/21  Microscopic Examination   Urine  Result Value Ref Range   WBC, UA None seen 0 - 5 /hpf   RBC 0-2 0 - 2 /hpf   Epithelial Cells (non renal) 0-10 0 - 10 /hpf   Mucus, UA Present (A) Not Estab.   Bacteria, UA Few (A) None seen/Few  Bayer DCA Hb A1c Waived  Result Value Ref Range   HB A1C (BAYER DCA - WAIVED) 5.5 <7.0 %  CBC with Differential/Platelet  Result Value Ref Range   WBC 6.0 3.4 - 10.8 x10E3/uL   RBC 4.24 3.77 - 5.28 x10E6/uL   Hemoglobin 11.7 11.1 - 15.9 g/dL   Hematocrit 36.3 34.0 - 46.6 %   MCV 86 79 - 97 fL   MCH 27.6  26.6 - 33.0 pg   MCHC 32.2 31.5 - 35.7 g/dL   RDW 14.3 11.7 - 15.4 %   Platelets 261 150 - 450 x10E3/uL   Neutrophils 48 Not Estab. %   Lymphs 40 Not Estab. %   Monocytes 9 Not Estab. %   Eos 2 Not Estab. %   Basos 1 Not Estab. %   Neutrophils Absolute 2.9 1.4 - 7.0 x10E3/uL   Lymphocytes Absolute 2.4 0.7 - 3.1 x10E3/uL   Monocytes Absolute 0.5 0.1 - 0.9 x10E3/uL   EOS (ABSOLUTE) 0.1 0.0 - 0.4 x10E3/uL   Basophils Absolute 0.0 0.0 - 0.2 x10E3/uL   Immature Granulocytes 0 Not Estab. %   Immature Grans (Abs) 0.0 0.0 - 0.1 x10E3/uL  Comprehensive metabolic panel  Result Value Ref Range   Glucose 75 65 - 99 mg/dL   BUN 14 6 - 24 mg/dL   Creatinine, Ser 0.75 0.57 - 1.00 mg/dL   eGFR 102 >59 mL/min/1.73   BUN/Creatinine Ratio 19 9 - 23   Sodium 139 134 - 144 mmol/L   Potassium 3.8 3.5 - 5.2 mmol/L   Chloride 106 96 - 106 mmol/L   CO2 22 20 - 29 mmol/L   Calcium 8.8 8.7 - 10.2 mg/dL   Total Protein 6.5 6.0 - 8.5 g/dL   Albumin 3.8 3.8 - 4.8 g/dL   Globulin, Total 2.7 1.5 - 4.5 g/dL   Albumin/Globulin Ratio 1.4 1.2 - 2.2   Bilirubin Total 0.2 0.0 - 1.2 mg/dL   Alkaline Phosphatase 47 44 - 121 IU/L   AST 9 0 - 40 IU/L   ALT 8 0 - 32 IU/L  Lipid panel  Result Value Ref Range   Cholesterol, Total 176 100 - 199 mg/dL   Triglycerides 80 0 - 149 mg/dL   HDL 56 >39 mg/dL   VLDL Cholesterol Cal 15 5 - 40 mg/dL   LDL Chol Calc (NIH) 105 (H) 0 - 99 mg/dL   Chol/HDL Ratio 3.1 0.0 - 4.4 ratio  TSH  Result Value Ref Range   TSH 1.790 0.450 - 4.500 uIU/mL  Urinalysis, Routine w reflex microscopic  Result Value Ref Range   Specific Gravity, UA >1.030 (H) 1.005 - 1.030   pH, UA 5.5 5.0 - 7.5   Color, UA Yellow Yellow   Appearance Ur Cloudy (A) Clear   Leukocytes,UA Negative Negative   Protein,UA Trace (A) Negative/Trace   Glucose, UA Negative Negative   Ketones, UA Negative Negative   RBC, UA Trace (A) Negative   Bilirubin, UA Negative Negative   Urobilinogen, Ur  0.2 0.2 - 1.0  mg/dL   Nitrite, UA Negative Negative   Microscopic Examination See below:         Current Outpatient Medications:    fluconazole (DIFLUCAN) 150 MG tablet, Take 150 mg by mouth once., Disp: , Rfl:    hydrochlorothiazide (HYDRODIURIL) 25 MG tablet, Take 25 mg by mouth daily. for high blood pressure, Disp: , Rfl:    meloxicam (MOBIC) 15 MG tablet, meloxicam 15 mg tablet  Take 1 tablet every day by oral route with meals., Disp: , Rfl:    tiZANidine (ZANAFLEX) 4 MG tablet, Take 1 tablet (4 mg total) by mouth every 6 (six) hours as needed for muscle spasms., Disp: 30 tablet, Rfl: 0    Assessment & Plan:  1, HTN is on hctz for such Hasnt had bp meds today. Consider adding lisinopril. Continue current meds.  Medication compliance emphasised. pt advised to keep Bp logs. Pt verbalised understanding of the same. Pt to have a low salt diet . Exercise to reach a goal of at least 150 mins a week.  lifestyle modifications explained and pt understands importance of the above.  2.  Recurrent Vaginal candidiasis s/p diflucan Will refer to obgyn  ncrease water intake. At least 64 oz - 72 oz.   4.Left shoulder pain will check xryas of such ? Tenditnitis Willneed NSDAIs and exercise - given print out Consider PT  5. DJD in hips per pt has seen ortho for such  To address the shoulders.   Problem List Items Addressed This Visit   None    Orders Placed This Encounter  Procedures   CMP14+EGFR   CBC with Differential/Platelet   Lipid panel   Ambulatory referral to Obstetrics / Gynecology     No orders of the defined types were placed in this encounter.    Follow up plan: No follow-ups on file.

## 2021-02-19 NOTE — Patient Instructions (Signed)
Shoulder Range of Motion Exercises ?Shoulder range of motion (ROM) exercises are done to keep the shoulder moving freely or to increase movement. They are often recommended for people who have shoulder pain or stiffness or who are recovering from a shoulder surgery. ?Phase 1 exercises ?When you are able, do this exercise 1-2 times per day for 30-60 seconds in each direction, or as directed by your health care provider. ?Pendulum exercise ?To do this exercise while sitting: ?Sit in a chair or at the edge of your bed with your feet flat on the floor. ?Let your affected arm hang down in front of you over the edge of the bed or chair. ?Relax your shoulder, arm, and hand. ?Rock your body so your arm gently swings in small circles. You can also use your unaffected arm to start the motion. ?Repeat changing the direction of the circles, swinging your arm left and right, and swinging your arm forward and back. ?To do this exercise while standing: ?Stand next to a sturdy chair or table, and hold on to it with your hand on your unaffected side. ?Bend forward at the waist. ?Bend your knees slightly. ?Relax your shoulder, arm, and hand. ?While keeping your shoulder relaxed, use body motion to swing your arm in small circles. ?Repeat changing the direction of the circles, swinging your arm left and right, and swinging your arm forward and back. ?Between exercises, stand up tall and take a short break to relax your lower back. ? ?Phase 2 exercises ?Do these exercises 1-2 times per day or as told by your health care provider. Hold each stretch for 30 seconds, and repeat 3 times. Do the exercises with one or both arms as instructed by your health care provider. ?For these exercises, sit at a table with your hand and arm supported by the table. A chair that slides easily or has wheels can be helpful. ?External rotation ?Turn your chair so that your affected side is nearest to the table. ?Place your forearm on the table to your side.  Bend your elbow about 90? at the elbow (right angle) and place your hand palm facing down on the table. Your elbow should be about 6 inches away from your side. ?Keeping your arm on the table, lean your body forward. ?Abduction ?Turn your chair so that your affected side is nearest to the table. ?Place your forearm and hand on the table so that your thumb points toward the ceiling and your arm is straight out to your side. ?Slide your hand out to the side and away from you, using your unaffected arm to do the work. ?To increase the stretch, you can slide your chair away from the table. ?Flexion: forward stretch ?Sit facing the table. Place your hand and elbow on the table in front of you. ?Slide your hand forward and away from you, using your unaffected arm to do the work. ?To increase the stretch, you can slide your chair backward. ?Phase 3 exercises ?Do these exercises 1-2 times per day or as told by your health care provider. Hold each stretch for 30 seconds, and repeat 3 times. Do the exercises with one or both arms as instructed by your health care provider. ?Cross-body stretch: posterior capsule stretch ?Lift your arm straight out in front of you. ?Bend your arm 90? at the elbow (right angle) so your forearm moves across your body. ?Use your other arm to gently pull the elbow across your body, toward your other shoulder. ?Wall climbs ?Stand with   your affected arm extended out to the side with your hand resting on a door frame. ?Slide your hand slowly up the door frame. ?To increase the stretch, step through the door frame. Keep your body upright and do not lean. ?Wand exercises ?You will need a cane, a piece of PVC pipe, or a sturdy wooden dowel for wand exercises. ?Flexion ?To do this exercise while standing: ?Hold the wand with both of your hands, palms down. ?Using the other arm to help, lift your arms up and over your head, if able. ?Push upward with your other arm to gently increase the stretch. ?To do  this exercise while lying down: ?Lie on your back with your elbows resting on the floor and the wand in both your hands. Your hands will be palm down, or pointing toward your feet. ?Lift your hands toward the ceiling, using your unaffected arm to help if needed. ?Bring your arms overhead as able, using your unaffected arm to help if needed. ?Internal rotation ?Stand while holding the wand behind you with both hands. Your unaffected arm should be extended above your head with the arm of the affected side extended behind you at the level of your waist. The wand should be pointing straight up and down as you hold it. ?Slowly pull the wand up behind your back by straightening the elbow of your unaffected arm and bending the elbow of your affected arm. ?External rotation ?Lie on your back with your affected upper arm supported on a small pillow or rolled towel. When you first do this exercise, keep your upper arm close to your body. Over time, bring your arm up to a 90? angle out to the side. ?Hold the wand across your stomach and with both hands palm up. Your elbow on your affected side should be bent at a 90? angle. ?Use your unaffected side to help push your forearm away from you and toward the floor. Keep your elbow on your affected side bent at a 90? angle. ?Contact a health care provider if you have: ?New or increasing pain. ?New numbness, tingling, weakness, or discoloration in your arm or hand. ?This information is not intended to replace advice given to you by your health care provider. Make sure you discuss any questions you have with your health care provider. ?Document Revised: 08/13/2017 Document Reviewed: 08/13/2017 ?Elsevier Patient Education ? 2022 [employer] ? ?

## 2021-02-21 DIAGNOSIS — Z0000 Encounter for general adult medical examination without abnormal findings: Secondary | ICD-10-CM | POA: Insufficient documentation

## 2021-04-09 ENCOUNTER — Encounter: Payer: BC Managed Care – PPO | Admitting: Obstetrics

## 2021-04-27 ENCOUNTER — Encounter: Payer: Self-pay | Admitting: Obstetrics

## 2021-04-27 ENCOUNTER — Other Ambulatory Visit: Payer: Self-pay

## 2021-04-27 ENCOUNTER — Ambulatory Visit: Payer: BC Managed Care – PPO | Admitting: Obstetrics

## 2021-04-27 VITALS — BP 130/80 | Ht 65.0 in | Wt 218.0 lb

## 2021-04-27 DIAGNOSIS — Z124 Encounter for screening for malignant neoplasm of cervix: Secondary | ICD-10-CM

## 2021-04-27 DIAGNOSIS — Z1231 Encounter for screening mammogram for malignant neoplasm of breast: Secondary | ICD-10-CM | POA: Diagnosis not present

## 2021-04-27 DIAGNOSIS — Z113 Encounter for screening for infections with a predominantly sexual mode of transmission: Secondary | ICD-10-CM | POA: Diagnosis not present

## 2021-04-27 DIAGNOSIS — Z01419 Encounter for gynecological examination (general) (routine) without abnormal findings: Secondary | ICD-10-CM | POA: Diagnosis not present

## 2021-04-27 NOTE — Progress Notes (Signed)
Gynecology Annual Exam  PCP: Loura Pardon, MD  Chief Complaint:  Chief Complaint  Patient presents with   Referral    Referred for pap smear; doesn't know why she was sent here; for the past few months has been getting a yeast inf either before or after her periods.  That's why she went to referring provider to talk about that and was referred.  Paps normal in the past.    History of Present Illness: Patient is a 45 y.o. G3P0 presents for annual exam as a new patient. She was referred by a PCP as it has been several years since her last pap.The patient shares that she has had problmes with vaginal discharge from time to time that resolves with medication, but reoccurs.  Casy is single, but in a [employer]. She has three children, all born by CS.She had a BTL in 2001.  LMP: Patient's last menstrual period was 04/06/2021 (exact date). Average Interval: regular, 28 days Duration of flow: 5 days Heavy Menses: yes Clots: no Intermenstrual Bleeding: no Postcoital Bleeding: no Dysmenorrhea: no   The patient is not heterosexually sexually active. She currently uses tubal ligation for contraception. She denies dyspareunia.  The patient does not perform self breast exams.  There is no notable family history of breast or ovarian cancer in her family.  The patient wears seatbelts: yes.   The patient has regular exercise: no.    The patient denies current symptoms of depression.    Review of Systems: Review of Systems  Constitutional: Negative.   HENT: Negative.    Eyes: Negative.   Respiratory: Negative.    Cardiovascular: Negative.        She is followed by her PCP for hypertension.  Gastrointestinal: Negative.   Genitourinary: Negative.   Musculoskeletal: Negative.   Skin: Negative.   Neurological: Negative.   Endo/Heme/Allergies: Negative.   Psychiatric/Behavioral: Negative.     Past Medical History:  Patient Active Problem List   Diagnosis Date Noted    Annual physical exam 02/21/2021   Left shoulder pain 02/19/2021   Screening for cervical cancer 02/19/2021    Past Surgical History:  Past Surgical History:  Procedure Laterality Date   CESAREAN SECTION     TONSILLECTOMY     TUBAL LIGATION      Gynecologic History:  Patient's last menstrual period was 04/06/2021 (exact date). Contraception: tubal ligation Last Pap: Results were: several years ago no abnormalities  Last mammogram: 2021 Results were: BI-RAD I  Obstetric History: G3P0  Family History:  Family History  Problem Relation Age of Onset   Cancer Mother    COPD Mother    Breast cancer Mother 80   Breast cancer Maternal Grandmother 33    Social History:  Social History   Socioeconomic History   Marital status: Single    Spouse name: Not on file   Number of children: Not on file   Years of education: Not on file   Highest education level: Not on file  Occupational History   Not on file  Tobacco Use   Smoking status: Every Day    Packs/day: 0.50    Types: Cigarettes   Smokeless tobacco: Never  Vaping Use   Vaping Use: Never used  Substance and Sexual Activity   Alcohol use: No   Drug use: No   Sexual activity: Yes    Birth control/protection: None  Other Topics Concern   Not on file  Social History Narrative   Not on  file   Social Determinants of Health   Financial Resource Strain: Not on file  Food Insecurity: Not on file  Transportation Needs: Not on file  Physical Activity: Not on file  Stress: Not on file  Social Connections: Not on file  Intimate Partner Violence: Not on file    Allergies:  Allergies  Allergen Reactions   Ciprofloxacin Hives    Medications: Prior to Admission medications   Medication Sig Start Date End Date Taking? Authorizing Provider  fluconazole (DIFLUCAN) 150 MG tablet Take 150 mg by mouth once. 02/05/21  Yes [provider]  hydrochlorothiazide (HYDRODIURIL) 25 MG tablet Take 25 mg by mouth daily.  for high blood pressure 12/15/18  Yes [provider]  tiZANidine (ZANAFLEX) 4 MG tablet Take 1 tablet (4 mg total) by mouth every 6 (six) hours as needed for muscle spasms. 01/10/20  Yes Tommie Sams, DO    Physical Exam Vitals: Blood pressure 130/80, height 5\' 5"  (1.651 m), weight 218 lb (98.9 kg), last menstrual period 04/06/2021.  General: NAD HEENT: normocephalic, anicteric Thyroid: no enlargement, no palpable nodules Pulmonary: No increased work of breathing, CTAB Cardiovascular: RRR, distal pulses 2+ Breast: Breast symmetrical, no tenderness, no palpable nodules or masses, no skin or nipple retraction present, no nipple discharge.  No axillary or supraclavicular lymphadenopathy. Abdomen: NABS, soft, non-tender, non-distended.  Umbilicus without lesions.  No hepatomegaly, splenomegaly or masses palpable. No evidence of hernia  Genitourinary:  External: Normal external female genitalia.  Normal urethral meatus, normal Bartholin's and Skene's glands.    Vagina: Normal vaginal mucosa, no evidence of prolapse.    Cervix: Grossly normal in appearance, no bleeding  Uterus: Non-enlarged, mobile, normal contour.  No CMT  Adnexa: ovaries non-enlarged, no adnexal masses  Rectal: deferred  Lymphatic: no evidence of inguinal lymphadenopathy Extremities: no edema, erythema, or tenderness Neurologic: Grossly intact Psychiatric: mood appropriate, affect full  Female chaperone present for pelvic and breast  portions of the physical exam    Assessment: 45 y.o. G3P0 routine annual exam  Plan: Problem List Items Addressed This Visit   None Visit Diagnoses     Breast cancer screening by mammogram    -  Primary   Relevant Orders   MM DIGITAL SCREENING BILATERAL   Cervical cancer screening       Relevant Orders   Cervicovaginal ancillary only   Routine screening for STI (sexually transmitted infection)       Relevant Orders   Cervicovaginal ancillary only   Women's annual routine  gynecological examination           1) Mammogram - recommend yearly screening mammogram.  Mammogram Was ordered today   2) STI screening  wasoffered and declined  3) ASCCP guidelines and rational discussed.  Patient opts for every 3 years screening interval  4) Contraception - the patient is currently using  tubal ligation.  She is happy with her current form of contraception and plans to continue  5) Colonoscopy -- Screening recommended starting at age 44 for average risk individuals, age 30 for individuals deemed at increased risk (including African Americans) and recommended to continue until age 69.  For patient age 31-85 individualized approach is recommended.  Gold standard screening is via colonoscopy, Cologuard screening is an acceptable alternative for patient unwilling or unable to undergo colonoscopy.  "Colorectal cancer screening for average?risk adults: 2018 guideline update from the American Cancer Society"CA: A Cancer Journal for Clinicians: Dec 11, 2016   6) Routine healthcare maintenance including cholesterol,  diabetes screening discussed managed by PCP  7) Return in about 1 year (around 04/27/2022) for annual.  We will check her vaginal discharge for BV, yeast, and notify her with any notable results.  Mirna Mires, CNM  04/27/2021 12:27 PM   [facility], [facility] 04/27/2021, 12:16 PM

## 2021-04-30 ENCOUNTER — Other Ambulatory Visit (HOSPITAL_COMMUNITY)
Admission: RE | Admit: 2021-04-30 | Discharge: 2021-04-30 | Disposition: A | Discharge status: Home / Self Care | Payer: BC Managed Care – PPO | Source: Ambulatory Visit | Attending: Obstetrics and Gynecology | Admitting: Obstetrics and Gynecology

## 2021-04-30 DIAGNOSIS — Z124 Encounter for screening for malignant neoplasm of cervix: Secondary | ICD-10-CM | POA: Insufficient documentation

## 2021-04-30 DIAGNOSIS — Z01419 Encounter for gynecological examination (general) (routine) without abnormal findings: Secondary | ICD-10-CM | POA: Insufficient documentation

## 2021-04-30 NOTE — Addendum Note (Signed)
Addended by: Thomasene Mohair D on: 04/30/2021 03:20 PM   Modules accepted: Orders

## 2021-05-01 LAB — CYTOLOGY - PAP
Comment: NEGATIVE
Diagnosis: NEGATIVE
High risk HPV: NEGATIVE

## 2021-05-22 DIAGNOSIS — G8929 Other chronic pain: Secondary | ICD-10-CM | POA: Diagnosis not present

## 2021-05-22 DIAGNOSIS — M7552 Bursitis of left shoulder: Secondary | ICD-10-CM | POA: Diagnosis not present

## 2021-05-22 DIAGNOSIS — E669 Obesity, unspecified: Secondary | ICD-10-CM | POA: Diagnosis not present

## 2021-05-22 DIAGNOSIS — M25512 Pain in left shoulder: Secondary | ICD-10-CM | POA: Diagnosis not present

## 2021-05-24 ENCOUNTER — Other Ambulatory Visit: Payer: Self-pay | Admitting: Obstetrics

## 2021-05-24 DIAGNOSIS — Z1231 Encounter for screening mammogram for malignant neoplasm of breast: Secondary | ICD-10-CM

## 2021-06-09 DIAGNOSIS — M7661 Achilles tendinitis, right leg: Secondary | ICD-10-CM | POA: Diagnosis not present

## 2021-06-10 ENCOUNTER — Encounter: Payer: Self-pay | Admitting: Internal Medicine

## 2021-06-11 ENCOUNTER — Other Ambulatory Visit: Payer: Self-pay

## 2021-06-11 ENCOUNTER — Emergency Department: Payer: BC Managed Care – PPO

## 2021-06-11 ENCOUNTER — Emergency Department
Admission: EM | Admit: 2021-06-11 | Discharge: 2021-06-11 | Disposition: A | Discharge status: Home / Self Care | Payer: BC Managed Care – PPO | Attending: Emergency Medicine | Admitting: Emergency Medicine

## 2021-06-11 ENCOUNTER — Ambulatory Visit: Payer: Self-pay

## 2021-06-11 DIAGNOSIS — I10 Essential (primary) hypertension: Secondary | ICD-10-CM | POA: Insufficient documentation

## 2021-06-11 DIAGNOSIS — F17210 Nicotine dependence, cigarettes, uncomplicated: Secondary | ICD-10-CM | POA: Insufficient documentation

## 2021-06-11 DIAGNOSIS — R2241 Localized swelling, mass and lump, right lower limb: Secondary | ICD-10-CM | POA: Diagnosis not present

## 2021-06-11 DIAGNOSIS — Z79899 Other long term (current) drug therapy: Secondary | ICD-10-CM | POA: Insufficient documentation

## 2021-06-11 DIAGNOSIS — M7661 Achilles tendinitis, right leg: Secondary | ICD-10-CM | POA: Insufficient documentation

## 2021-06-11 DIAGNOSIS — M79604 Pain in right leg: Secondary | ICD-10-CM | POA: Diagnosis not present

## 2021-06-11 DIAGNOSIS — M79661 Pain in right lower leg: Secondary | ICD-10-CM | POA: Diagnosis not present

## 2021-06-11 MED ORDER — MELOXICAM 15 MG PO TABS: 15.0000 mg | ORAL_TABLET | Freq: Every day | ORAL | 0 refills | Status: DC

## 2021-06-11 NOTE — ED Triage Notes (Signed)
[facility] was [facility] from [facility] with RLE pain and swelling at the calf since Saturday morning, denies injury

## 2021-06-11 NOTE — ED Provider Notes (Signed)
[facility] Emergency Department Provider Note  ____________________________________________  Time seen: Approximately 6:37 PM  I have reviewed the triage vital signs and the nursing notes.   HISTORY  Chief Complaint Leg Pain    HPI Donna Odonnell is a 45 y.o. female who presents the emergency department complaining of calf pain.  Patient was referred from urgent care after developing nontraumatic calf pain 2 days ago.  Pain worsens with any movement.  No skin changes reported.  Patient was sent from urgent care for evaluation for DVT.  No history of PE or DVT.  No other complaints at this time.       Past Medical History:  Diagnosis Date   Hypertension     Patient Active Problem List   Diagnosis Date Noted   Annual physical exam 02/21/2021   Left shoulder pain 02/19/2021   Screening for cervical cancer 02/19/2021    Past Surgical History:  Procedure Laterality Date   CESAREAN SECTION     TONSILLECTOMY     TUBAL LIGATION      Prior to Admission medications   Medication Sig Start Date End Date Taking? Authorizing Provider  meloxicam (MOBIC) 15 MG tablet Take 1 tablet (15 mg total) by mouth daily. 06/11/21  Yes Murray Durrell, Delorise Royals, PA-C  fluconazole (DIFLUCAN) 150 MG tablet Take 150 mg by mouth once. 02/05/21   [provider]  hydrochlorothiazide (HYDRODIURIL) 25 MG tablet Take 25 mg by mouth daily. for high blood pressure 12/15/18   [provider]  tiZANidine (ZANAFLEX) 4 MG tablet Take 1 tablet (4 mg total) by mouth every 6 (six) hours as needed for muscle spasms. 01/10/20   Tommie Sams, DO    Allergies Ciprofloxacin  Family History  Problem Relation Age of Onset   Cancer Mother    COPD Mother    Breast cancer Mother 75   Breast cancer Maternal Grandmother 72    Social History Social History   Tobacco Use   Smoking status: Every Day    Packs/day: 0.50    Types: Cigarettes   Smokeless tobacco: Never  Vaping  Use   Vaping Use: Never used  Substance Use Topics   Alcohol use: Yes   Drug use: No     Review of Systems  Constitutional: No fever/chills Eyes: No visual changes. No discharge ENT: No upper respiratory complaints. Cardiovascular: no chest pain. Respiratory: no cough. No SOB. Gastrointestinal: No abdominal pain.  No nausea, no vomiting.  No diarrhea.  No constipation. Musculoskeletal: Positive for right calf pain Skin: Negative for rash, abrasions, lacerations, ecchymosis. Neurological: Negative for headaches, focal weakness or numbness.  10 System ROS otherwise negative.  ____________________________________________   PHYSICAL EXAM:  VITAL SIGNS: ED Triage Vitals  Enc Vitals Group     BP 06/11/21 1656 (!) 146/91     Pulse Rate 06/11/21 1656 87     Resp 06/11/21 1656 18     Temp 06/11/21 1656 98.4 F (36.9 C)     Temp Source 06/11/21 1656 Oral     SpO2 06/11/21 1656 100 %     Weight 06/11/21 1657 217 lb (98.4 kg)     Height 06/11/21 1657 5\' 5"  (1.651 m)     Head Circumference --      Peak Flow --      Pain Score 06/11/21 1657 7     Pain Loc --      Pain Edu? --      Excl. in GC? --  Constitutional: Alert and oriented. Well appearing and in no acute distress. Eyes: Conjunctivae are normal. PERRL. EOMI. Head: Atraumatic. ENT:      Ears:       Nose: No congestion/rhinnorhea.      Mouth/Throat: Mucous membranes are moist.  Neck: No stridor.  Cardiovascular: Normal rate, regular rhythm. Normal S1 and S2.  Good peripheral circulation. Respiratory: Normal respiratory effort without tachypnea or retractions. Lungs CTAB. Good air entry to the bases with no decreased or absent breath sounds. Musculoskeletal: Full range of motion to all extremities. No gross deformities appreciated.  Visualization of the right calf reveals no skin changes.  No gross erythema, edema when compared with unaffected extremity.  Patient is primarily tender over the lateral gastroc muscle  extending into the Achilles tendon.  No palpable deficits.  Patient still has good flexion and extension of her foot but doing so increases the pain over the inferior portion of her gastrocnemius muscle.  No other tenderness over the osseous or muscular structures of the leg.  Dorsalis pedis pulse and sensation intact distally. Neurologic:  Normal speech and language. No gross focal neurologic deficits are appreciated.  Skin:  Skin is warm, dry and intact. No rash noted. Psychiatric: Mood and affect are normal. Speech and behavior are normal. Patient exhibits appropriate insight and judgement.   ____________________________________________   LABS (all labs ordered are listed, but only abnormal results are displayed)  Labs Reviewed - No data to display ____________________________________________  EKG   ____________________________________________  RADIOLOGY I personally viewed and evaluated these images as part of my medical decision making, as well as reviewing the written report by the radiologist.  ED Provider Interpretation: No findings on ultrasound concerning for DVT  US Venous Img Lower Unilateral Right  Result Date: 06/11/2021 CLINICAL DATA:  Right lower extremity swelling EXAM: Right LOWER EXTREMITY VENOUS DOPPLER ULTRASOUND TECHNIQUE: Gray-scale sonography with compression, as well as color and duplex ultrasound, were performed to evaluate the deep venous system(s) from the level of the common femoral vein through the popliteal and proximal calf veins. COMPARISON:  None. FINDINGS: VENOUS Normal compressibility of the common femoral, superficial femoral, and popliteal veins, as well as the visualized calf veins. Visualized portions of profunda femoral vein and great saphenous vein unremarkable. No filling defects to suggest DVT on grayscale or color Doppler imaging. Doppler waveforms show normal direction of venous flow, normal respiratory plasticity and response to augmentation.  Limited views of the contralateral common femoral vein are unremarkable. OTHER None. Limitations: none IMPRESSION: Negative. Electronically Signed   By: Jasmine Pang M.D.   On: 06/11/2021 18:04    ____________________________________________    PROCEDURES  Procedure(s) performed:    Procedures    Medications - No data to display   ____________________________________________   INITIAL IMPRESSION / ASSESSMENT AND PLAN / ED COURSE  Pertinent labs & imaging results that were available during my care of the patient were reviewed by me and considered in my medical decision making (see chart for details).  Review of the  [place]RS was performed in accordance of the [facility] prior to dispensing any controlled drugs.           Patient's diagnosis is consistent with gastrocnemius strain versus Achilles tendinitis.  Patient presents emergency department with right calf pain.  Referred from urgent care for ultrasound to rule out DVT.  Ultrasound was negative.  Patient has pain along the inferior aspect of her gastrocnemius muscle at the insertion site of the Achilles tendon.  Nontraumatic in  nature.  Patient will be placed on anti-inflammatory and given walking boot for symptom control..  Follow-up primary care as needed. Patient is given ED precautions to return to the ED for any worsening or new symptoms.     ____________________________________________  FINAL CLINICAL IMPRESSION(S) / ED DIAGNOSES  Final diagnoses:  Achilles tendinitis of right lower extremity      NEW MEDICATIONS STARTED DURING THIS VISIT:  ED Discharge Orders          Ordered    meloxicam (MOBIC) 15 MG tablet  Daily        06/11/21 1844                This chart was dictated using voice recognition software/Dragon. Despite best efforts to proofread, errors can occur which can change the meaning. Any change was purely unintentional.    Racheal Patches, PA-C 06/11/21 1844    Merwyn Katos, MD 06/12/21 [phone]

## 2021-06-11 NOTE — ED Notes (Signed)
Reviewed discharge instructions, follow-up care, and prescriptions with patient. Patient verbalized understanding of all information reviewed. Patient stable, with no distress noted at this time.    

## 2021-06-11 NOTE — Telephone Encounter (Signed)
Patient was at [facility] when calling.   UC suspects a blood clot in her leg  Patient is being driven to [facility] ED now. For follow up.

## 2021-06-12 NOTE — Telephone Encounter (Signed)
noted 

## 2021-06-19 ENCOUNTER — Ambulatory Visit
Admission: RE | Admit: 2021-06-19 | Discharge: 2021-06-19 | Disposition: A | Discharge status: Home / Self Care | Payer: BC Managed Care – PPO | Source: Ambulatory Visit | Attending: Obstetrics | Admitting: Obstetrics

## 2021-06-19 ENCOUNTER — Encounter: Payer: Self-pay | Admitting: Obstetrics

## 2021-06-19 ENCOUNTER — Other Ambulatory Visit: Payer: Self-pay

## 2021-06-19 DIAGNOSIS — Z1231 Encounter for screening mammogram for malignant neoplasm of breast: Secondary | ICD-10-CM | POA: Insufficient documentation

## 2021-07-12 ENCOUNTER — Encounter: Payer: Self-pay | Admitting: Internal Medicine

## 2021-07-13 ENCOUNTER — Telehealth (INDEPENDENT_AMBULATORY_CARE_PROVIDER_SITE_OTHER): Payer: BC Managed Care – PPO | Admitting: Internal Medicine

## 2021-07-13 ENCOUNTER — Encounter: Payer: Self-pay | Admitting: Internal Medicine

## 2021-07-13 DIAGNOSIS — U071 COVID-19: Secondary | ICD-10-CM | POA: Diagnosis not present

## 2021-07-13 MED ORDER — FEXOFENADINE HCL 180 MG PO TABS: 180.0000 mg | ORAL_TABLET | Freq: Every day | ORAL | 1 refills | Status: DC

## 2021-07-13 MED ORDER — BENZONATATE 100 MG PO CAPS: 100.0000 mg | ORAL_CAPSULE | Freq: Three times a day (TID) | ORAL | 0 refills | Status: DC | PRN

## 2021-07-13 MED ORDER — AZITHROMYCIN 250 MG PO TABS: ORAL_TABLET | ORAL | 0 refills | Status: AC

## 2021-07-13 NOTE — Progress Notes (Signed)
There were no vitals taken for this visit.   Subjective:    Patient ID: Donna Odonnell, female    DOB: 09-Sep-1975, 45 y.o.   MRN: [id number]  Chief Complaint  Patient presents with   Covid Positive    Tested positive(took 2 tests) on 07/11/21 symptoms body aches, congestion, runny nose, headache. Symptoms started on 07/06/21    HPI: Donna Odonnell is a 45 y.o. female   This visit was completed via video visit through MyChart due to the restrictions of the COVID-19 pandemic. All issues as above were discussed and addressed. Physical exam was done as above through visual confirmation on video through MyChart. If it was felt that the patient should be evaluated in the office, they were directed there. The patient verbally consented to this visit. Location of the patient: home Location of the provider: work Those involved with this call:  Provider: Loura Pardon, MD CMA: Tristan Schroeder, CMA Front Desk/Registration: [employer]  Time spent on call: 10 minutes with patient face to face via video conference. More than 50% of this time was spent in counseling and coordination of care. 10 minutes total spent in review of patient's record and preparation of their chart.    URI  This is a new problem. Associated symptoms include congestion, coughing, headaches, rhinorrhea and sneezing. Pertinent negatives include no chest pain, diarrhea, dysuria, ear pain, joint pain, joint swelling, nausea, neck pain, plugged ear sensation, rash, sinus pain, sore throat, swollen glands, vomiting or wheezing. Associated symptoms comments: Yellowish d/c from nose and coughing up purulent phlegm During the day its better worse @ night. .   Chief Complaint  Patient presents with   Covid Positive    Tested positive(took 2 tests) on 07/11/21 symptoms body aches, congestion, runny nose, headache. Symptoms started on 07/06/21    Relevant past medical, surgical, family and social history reviewed and updated as  indicated. Interim medical history since our last visit reviewed. Allergies and medications reviewed and updated.  Review of Systems  HENT:  Positive for congestion, rhinorrhea and sneezing. Negative for ear pain, sinus pain and sore throat.   Respiratory:  Positive for cough. Negative for wheezing.   Cardiovascular:  Negative for chest pain.  Gastrointestinal:  Negative for diarrhea, nausea and vomiting.  Genitourinary:  Negative for dysuria.  Musculoskeletal:  Negative for joint pain and neck pain.  Skin:  Negative for rash.  Neurological:  Positive for headaches.   Per HPI unless specifically indicated above     Objective:    There were no vitals taken for this visit.  Wt Readings from Last 3 Encounters:  06/11/21 217 lb (98.4 kg)  04/27/21 218 lb (98.9 kg)  02/19/21 215 lb 12.8 oz (97.9 kg)    Physical Exam  Unable to peform sec to virtual visit.   Results for orders placed or performed in visit on 04/27/21  Cytology - PAP  Result Value Ref Range   High risk HPV Negative    Adequacy      Satisfactory for evaluation; transformation zone component PRESENT.   Diagnosis      - Negative for intraepithelial lesion or malignancy (NILM)   Comment Normal Reference Range HPV - Negative         Current Outpatient Medications:    azithromycin (ZITHROMAX) 250 MG tablet, Take 2 tablets on day 1, then 1 tablet daily on days 2 through 5, Disp: 6 tablet, Rfl: 0   benzonatate (TESSALON) 100 MG capsule, Take 1  capsule (100 mg total) by mouth 3 (three) times daily as needed for cough., Disp: 20 capsule, Rfl: 0   fexofenadine (ALLEGRA ALLERGY) 180 MG tablet, Take 1 tablet (180 mg total) by mouth daily., Disp: 10 tablet, Rfl: 1   hydrochlorothiazide (HYDRODIURIL) 25 MG tablet, Take 25 mg by mouth daily. for high blood pressure, Disp: , Rfl:    meloxicam (MOBIC) 15 MG tablet, Take 1 tablet (15 mg total) by mouth daily., Disp: 30 tablet, Rfl: 0   tiZANidine (ZANAFLEX) 4 MG tablet, Take 1  tablet (4 mg total) by mouth every 6 (six) hours as needed for muscle spasms., Disp: 30 tablet, Rfl: 0    Assessment & Plan:  COVID : positive :  Increase fluid intake.  Headahce - tyelnol every 4-6 hrs prn and alternate this with ibubrufen 800 mg q 8 hrly. Sinus pressure: use steam inhalation.  OTC -  Allegra / claritin.  5 days quarantine.  Ok to rtw in 5 days if tests -ve follow  Pt will need to come in wait in the car and get swabbed for flu and COVID test today  Pt verbalized understanding of such, to get to the office at today and get a curb side test for the above.    Problem List Items Addressed This Visit       Other   COVID - Primary   Relevant Medications   azithromycin (ZITHROMAX) 250 MG tablet     No orders of the defined types were placed in this encounter.    Meds ordered this encounter  Medications   fexofenadine (ALLEGRA ALLERGY) 180 MG tablet    Sig: Take 1 tablet (180 mg total) by mouth daily.    Dispense:  10 tablet    Refill:  1   benzonatate (TESSALON) 100 MG capsule    Sig: Take 1 capsule (100 mg total) by mouth 3 (three) times daily as needed for cough.    Dispense:  20 capsule    Refill:  0   azithromycin (ZITHROMAX) 250 MG tablet    Sig: Take 2 tablets on day 1, then 1 tablet daily on days 2 through 5    Dispense:  6 tablet    Refill:  0     Follow up plan:

## 2021-08-22 ENCOUNTER — Other Ambulatory Visit: Payer: BC Managed Care – PPO

## 2021-08-29 ENCOUNTER — Encounter: Payer: BC Managed Care – PPO | Admitting: Internal Medicine

## 2021-09-06 ENCOUNTER — Ambulatory Visit (INDEPENDENT_AMBULATORY_CARE_PROVIDER_SITE_OTHER): Payer: BC Managed Care – PPO | Admitting: Internal Medicine

## 2021-09-06 ENCOUNTER — Encounter: Payer: Self-pay | Admitting: Internal Medicine

## 2021-09-06 ENCOUNTER — Other Ambulatory Visit: Payer: Self-pay

## 2021-09-06 VITALS — BP 132/85 | HR 85 | Temp 98.9°F | Ht 65.0 in | Wt 221.8 lb

## 2021-09-06 DIAGNOSIS — I10 Essential (primary) hypertension: Secondary | ICD-10-CM | POA: Diagnosis not present

## 2021-09-06 DIAGNOSIS — Z0000 Encounter for general adult medical examination without abnormal findings: Secondary | ICD-10-CM | POA: Diagnosis not present

## 2021-09-06 DIAGNOSIS — Z136 Encounter for screening for cardiovascular disorders: Secondary | ICD-10-CM | POA: Diagnosis not present

## 2021-09-06 LAB — URINALYSIS, ROUTINE W REFLEX MICROSCOPIC
Bilirubin, UA: NEGATIVE
Glucose, UA: NEGATIVE
Ketones, UA: NEGATIVE
Leukocytes,UA: NEGATIVE
Nitrite, UA: NEGATIVE
Protein,UA: NEGATIVE
Specific Gravity, UA: 1.03 (ref 1.005–1.030)
Urobilinogen, Ur: 0.2 mg/dL (ref 0.2–1.0)
pH, UA: 6 (ref 5.0–7.5)

## 2021-09-06 LAB — MICROSCOPIC EXAMINATION
Bacteria, UA: NONE SEEN
WBC, UA: NONE SEEN /hpf (ref 0–5)

## 2021-09-06 MED ORDER — HYDROCHLOROTHIAZIDE 12.5 MG PO TABS: 12.5000 mg | ORAL_TABLET | Freq: Every day | ORAL | 5 refills | Status: DC

## 2021-09-06 NOTE — Progress Notes (Signed)
BP 132/85    Pulse 85    Temp 98.9 F (37.2 C) (Oral)    Ht 5\' 5"  (1.651 m)    Wt 221 lb 12.8 oz (100.6 kg)    SpO2 99%    BMI 36.91 kg/m    Subjective:    Patient ID: Donna Odonnell, female    DOB: Oct 20, 1975, 46 y.o.   MRN: [id number]  Chief Complaint  Patient presents with   Annual Exam    HPI: Donna Odonnell is a 46 y.o. female  Pt is here for a physical last time had COVID x 3 times. Last was in December  Hip Pain  Incident onset: rt hip pain worse than left sees ortho for such. Pertinent negatives include no numbness.   Chief Complaint  Patient presents with   Annual Exam    Relevant past medical, surgical, family and social history reviewed and updated as indicated. Interim medical history since our last visit reviewed. Allergies and medications reviewed and updated.  Review of Systems  Constitutional:  Negative for activity change, appetite change, chills, fatigue and fever.  HENT:  Negative for congestion, ear discharge, ear pain and facial swelling.   Eyes:  Negative for pain and itching.  Respiratory:  Negative for cough, chest tightness, shortness of breath and wheezing.   Cardiovascular:  Negative for chest pain, palpitations and leg swelling.  Gastrointestinal:  Negative for abdominal distention, abdominal pain, blood in stool, constipation, diarrhea, nausea and vomiting.  Endocrine: Negative for cold intolerance, heat intolerance, polydipsia, polyphagia and polyuria.  Genitourinary:  Negative for difficulty urinating, dysuria, flank pain, frequency, hematuria and urgency.  Musculoskeletal:  Negative for arthralgias, gait problem, joint swelling and myalgias.  Skin:  Negative for color change, rash and wound.  Neurological:  Negative for dizziness, tremors, speech difficulty, weakness, light-headedness, numbness and headaches.  Hematological:  Does not bruise/bleed easily.  Psychiatric/Behavioral:  Negative for agitation, confusion, decreased concentration,  sleep disturbance and suicidal ideas.    Per HPI unless specifically indicated above     Objective:    BP 132/85    Pulse 85    Temp 98.9 F (37.2 C) (Oral)    Ht 5\' 5"  (1.651 m)    Wt 221 lb 12.8 oz (100.6 kg)    SpO2 99%    BMI 36.91 kg/m   Wt Readings from Last 3 Encounters:  09/06/21 221 lb 12.8 oz (100.6 kg)  06/11/21 217 lb (98.4 kg)  04/27/21 218 lb (98.9 kg)    Physical Exam Vitals and nursing note reviewed.  Constitutional:      General: She is not in acute distress.    Appearance: Normal appearance. She is not ill-appearing or diaphoretic.  HENT:     Head: Normocephalic and atraumatic.     Right Ear: Tympanic membrane and external ear normal. There is no impacted cerumen.     Left Ear: External ear normal.     Nose: No congestion or rhinorrhea.     Mouth/Throat:     Pharynx: No oropharyngeal exudate or posterior oropharyngeal erythema.  Eyes:     Conjunctiva/sclera: Conjunctivae normal.     Pupils: Pupils are equal, round, and reactive to light.  Cardiovascular:     Rate and Rhythm: Normal rate and regular rhythm.     Heart sounds: No murmur heard.   No friction rub. No gallop.  Pulmonary:     Effort: No respiratory distress.     Breath sounds: No stridor. No wheezing or  rhonchi.  Chest:     Chest wall: No tenderness.  Abdominal:     General: Abdomen is flat. Bowel sounds are normal. There is no distension.     Palpations: Abdomen is soft. There is no mass.     Tenderness: There is no abdominal tenderness. There is no guarding.  Musculoskeletal:        General: No swelling or deformity.     Cervical back: Normal range of motion and neck supple. No rigidity or tenderness.     Right lower leg: No edema.     Left lower leg: No edema.  Skin:    General: Skin is warm and dry.     Coloration: Skin is not jaundiced.     Findings: No erythema.  Neurological:     Mental Status: She is alert and oriented to person, place, and time. Mental status is at baseline.   Psychiatric:        Mood and Affect: Mood normal.        Behavior: Behavior normal.        Thought Content: Thought content normal.        Judgment: Judgment normal.    Results for orders placed or performed in visit on 04/27/21  Cytology - PAP  Result Value Ref Range   High risk HPV Negative    Adequacy      Satisfactory for evaluation; transformation zone component PRESENT.   Diagnosis      - Negative for intraepithelial lesion or malignancy (NILM)   Comment Normal Reference Range HPV - Negative         Current Outpatient Medications:    fexofenadine (ALLEGRA ALLERGY) 180 MG tablet, Take 1 tablet (180 mg total) by mouth daily., Disp: 10 tablet, Rfl: 1   meloxicam (MOBIC) 15 MG tablet, Take 1 tablet (15 mg total) by mouth daily., Disp: 30 tablet, Rfl: 0   hydrochlorothiazide (HYDRODIURIL) 12.5 MG tablet, Take 1 tablet (12.5 mg total) by mouth daily. for high blood pressure, Disp: 30 tablet, Rfl: 5    Assessment & Plan:  PHYSICAL :  Physical Wnl will check CMP, FLP, CBC,TSH.  Problem List Items Addressed This Visit       Cardiovascular and Mediastinum   Primary hypertension   Relevant Medications   hydrochlorothiazide (HYDRODIURIL) 12.5 MG tablet     Other   Annual physical exam - Primary   Relevant Orders   TSH   Lipid panel   CBC with Differential/Platelet   Comprehensive metabolic panel   Urinalysis, Routine w reflex microscopic     Orders Placed This Encounter  Procedures   TSH   Lipid panel   CBC with Differential/Platelet   Comprehensive metabolic panel   Urinalysis, Routine w reflex microscopic     Meds ordered this encounter  Medications   hydrochlorothiazide (HYDRODIURIL) 12.5 MG tablet    Sig: Take 1 tablet (12.5 mg total) by mouth daily. for high blood pressure    Dispense:  30 tablet    Refill:  5     Follow up plan: Return in about 6 months (around 03/06/2022).

## 2021-09-07 LAB — CBC WITH DIFFERENTIAL/PLATELET
Basophils Absolute: 0 10*3/uL (ref 0.0–0.2)
Basos: 1 %
EOS (ABSOLUTE): 0.1 10*3/uL (ref 0.0–0.4)
Eos: 2 %
Hematocrit: 34.7 % (ref 34.0–46.6)
Hemoglobin: 11.4 g/dL (ref 11.1–15.9)
Immature Grans (Abs): 0 10*3/uL (ref 0.0–0.1)
Immature Granulocytes: 0 %
Lymphocytes Absolute: 1.8 10*3/uL (ref 0.7–3.1)
Lymphs: 33 %
MCH: 27.8 pg (ref 26.6–33.0)
MCHC: 32.9 g/dL (ref 31.5–35.7)
MCV: 85 fL (ref 79–97)
Monocytes Absolute: 0.4 10*3/uL (ref 0.1–0.9)
Monocytes: 8 %
Neutrophils Absolute: 3.1 10*3/uL (ref 1.4–7.0)
Neutrophils: 56 %
Platelets: 256 10*3/uL (ref 150–450)
RBC: 4.1 x10E6/uL (ref 3.77–5.28)
RDW: 14.5 % (ref 11.7–15.4)
WBC: 5.5 10*3/uL (ref 3.4–10.8)

## 2021-09-07 LAB — COMPREHENSIVE METABOLIC PANEL
ALT: 7 IU/L (ref 0–32)
AST: 12 IU/L (ref 0–40)
Albumin/Globulin Ratio: 1.7 (ref 1.2–2.2)
Albumin: 3.9 g/dL (ref 3.8–4.8)
Alkaline Phosphatase: 44 IU/L (ref 44–121)
BUN/Creatinine Ratio: 11 (ref 9–23)
BUN: 10 mg/dL (ref 6–24)
Bilirubin Total: 0.2 mg/dL (ref 0.0–1.2)
CO2: 22 mmol/L (ref 20–29)
Calcium: 8.7 mg/dL (ref 8.7–10.2)
Chloride: 108 mmol/L — ABNORMAL HIGH (ref 96–106)
Creatinine, Ser: 0.91 mg/dL (ref 0.57–1.00)
Globulin, Total: 2.3 g/dL (ref 1.5–4.5)
Glucose: 114 mg/dL — ABNORMAL HIGH (ref 70–99)
Potassium: 4 mmol/L (ref 3.5–5.2)
Sodium: 141 mmol/L (ref 134–144)
Total Protein: 6.2 g/dL (ref 6.0–8.5)
eGFR: 80 mL/min/{1.73_m2} (ref >59)

## 2021-09-07 LAB — LIPID PANEL
Chol/HDL Ratio: 2.9 ratio (ref 0.0–4.4)
Cholesterol, Total: 166 mg/dL (ref 100–199)
HDL: 58 mg/dL (ref >39)
LDL Chol Calc (NIH): 98 mg/dL (ref 0–99)
Triglycerides: 49 mg/dL (ref 0–149)
VLDL Cholesterol Cal: 10 mg/dL (ref 5–40)

## 2021-09-07 LAB — TSH: TSH: 0.976 u[IU]/mL (ref 0.450–4.500)

## 2021-09-18 ENCOUNTER — Telehealth: Payer: Self-pay

## 2021-09-18 ENCOUNTER — Encounter: Payer: Self-pay | Admitting: Internal Medicine

## 2021-09-18 DIAGNOSIS — K649 Unspecified hemorrhoids: Secondary | ICD-10-CM

## 2021-09-18 NOTE — Telephone Encounter (Signed)
Scheduled for may 1  ?

## 2021-09-18 NOTE — Telephone Encounter (Signed)
Referral for hemorrhoids  °

## 2021-12-13 ENCOUNTER — Ambulatory Visit: Payer: BC Managed Care – PPO | Admitting: Gastroenterology

## 2021-12-18 DIAGNOSIS — G8929 Other chronic pain: Secondary | ICD-10-CM | POA: Diagnosis not present

## 2021-12-18 DIAGNOSIS — Z6836 Body mass index (BMI) 36.0-36.9, adult: Secondary | ICD-10-CM | POA: Diagnosis not present

## 2021-12-18 DIAGNOSIS — M160 Bilateral primary osteoarthritis of hip: Secondary | ICD-10-CM | POA: Diagnosis not present

## 2021-12-18 DIAGNOSIS — M25552 Pain in left hip: Secondary | ICD-10-CM | POA: Diagnosis not present

## 2021-12-18 DIAGNOSIS — M65312 Trigger thumb, left thumb: Secondary | ICD-10-CM | POA: Diagnosis not present

## 2021-12-18 DIAGNOSIS — M25551 Pain in right hip: Secondary | ICD-10-CM | POA: Diagnosis not present

## 2022-02-18 ENCOUNTER — Encounter: Payer: Self-pay | Admitting: Internal Medicine

## 2022-02-23 ENCOUNTER — Emergency Department: Payer: BC Managed Care – PPO

## 2022-02-23 ENCOUNTER — Emergency Department
Admission: EM | Admit: 2022-02-23 | Discharge: 2022-02-23 | Disposition: A | Discharge status: Home / Self Care | Payer: BC Managed Care – PPO | Attending: Emergency Medicine | Admitting: Emergency Medicine

## 2022-02-23 ENCOUNTER — Other Ambulatory Visit: Payer: Self-pay

## 2022-02-23 DIAGNOSIS — Z8616 Personal history of COVID-19: Secondary | ICD-10-CM | POA: Insufficient documentation

## 2022-02-23 DIAGNOSIS — F17210 Nicotine dependence, cigarettes, uncomplicated: Secondary | ICD-10-CM | POA: Diagnosis not present

## 2022-02-23 DIAGNOSIS — I10 Essential (primary) hypertension: Secondary | ICD-10-CM | POA: Diagnosis not present

## 2022-02-23 DIAGNOSIS — M25551 Pain in right hip: Secondary | ICD-10-CM | POA: Diagnosis not present

## 2022-02-23 DIAGNOSIS — Z79899 Other long term (current) drug therapy: Secondary | ICD-10-CM | POA: Insufficient documentation

## 2022-02-23 DIAGNOSIS — M1611 Unilateral primary osteoarthritis, right hip: Secondary | ICD-10-CM | POA: Insufficient documentation

## 2022-02-23 MED ORDER — KETOROLAC TROMETHAMINE 30 MG/ML IJ SOLN
30.0000 mg | Freq: Once | INTRAMUSCULAR | Status: AC
  Administered 2022-02-23: 30 mg via INTRAMUSCULAR
  Filled 2022-02-23: qty 1

## 2022-02-23 MED ORDER — MELOXICAM 15 MG PO TABS: 15.0000 mg | ORAL_TABLET | Freq: Every day | ORAL | 0 refills | Status: DC

## 2022-02-23 NOTE — ED Provider Notes (Signed)
[facility] Emergency Department Provider Note   ____________________________________________   None    (approximate)  I have reviewed the triage vital signs and the nursing notes.   HISTORY  Chief Complaint Hip Pain     HPI Donna Odonnell is a 46 y.o. female forced the emergency room for complaint of right hip pain.  Patient reports that she does have a history/diagnosis of osteoarthritis in bilateral hips. Patient reports that for the past 3 days she has had significant amount of pain in her right hip.  However over the last 24 hours the pain has become increasingly worse.  She reports the pain is now so severe that she is unable to sit/stand for any period of time. She denies any injury/falls.  She denies doing anything different from her normal daily activities at home or at work. Patient reports that shortly after waking 3 days ago the pain began for no reason. Patient is able to bear weight/stand/walk with minimal difficulty while in the emergency room.  Patient's gait is stable. However, she reports that she is having difficulty bearing weight and walking at home. Patient is tender to palpation over the right hip.  She has negative straight leg raise.  Pain is induced with medial rotation of the hip.  There is no redness/swelling noted to the hip.  There is no bony abnormality noted to the hip. Patient reports that her pain is an 8-10 out of 10.  She has not taken anything today for the pain. Patient reports that several years ago she had a similar pain in the other hip but cannot remember the outcome of that.    Past Medical History:  Diagnosis Date   Hypertension     Patient Active Problem List   Diagnosis Date Noted   Primary hypertension 09/06/2021   COVID 07/13/2021   Annual physical exam 02/21/2021   Left shoulder pain 02/19/2021   Screening for cervical cancer 02/19/2021    Past Surgical History:  Procedure Laterality Date    CESAREAN SECTION     TONSILLECTOMY     TUBAL LIGATION      Prior to Admission medications   Medication Sig Start Date End Date Taking? Authorizing Provider  meloxicam (MOBIC) 15 MG tablet Take 1 tablet (15 mg total) by mouth daily. 02/23/22  Yes Herschell Dimes, NP  fexofenadine (ALLEGRA ALLERGY) 180 MG tablet Take 1 tablet (180 mg total) by mouth daily. 07/13/21   Vigg, Avanti, MD  hydrochlorothiazide (HYDRODIURIL) 12.5 MG tablet Take 1 tablet (12.5 mg total) by mouth daily. for high blood pressure 09/06/21   Vigg, Avanti, MD    Allergies Ciprofloxacin  Family History  Problem Relation Age of Onset   Cancer Mother    COPD Mother    Breast cancer Mother 54   Breast cancer Maternal Grandmother 47    Social History Social History   Tobacco Use   Smoking status: Every Day    Packs/day: 0.50    Types: Cigarettes   Smokeless tobacco: Never  Vaping Use   Vaping Use: Never used  Substance Use Topics   Alcohol use: Yes   Drug use: No    Review of Systems  Constitutional: No fever/chills Eyes: No visual changes. ENT: No sore throat. Cardiovascular: Denies chest pain. Respiratory: Denies shortness of breath. Gastrointestinal: No abdominal pain.  No nausea, no vomiting.  No diarrhea.  No constipation. Genitourinary: Negative for dysuria. Musculoskeletal: Negative for back pain.  Positive for right hip  pain. Skin: Negative for rash. Neurological: Negative for headaches, focal weakness or numbness.   ____________________________________________   PHYSICAL EXAM:  VITAL SIGNS: ED Triage Vitals  Enc Vitals Group     BP 02/23/22 2044 (!) 162/94     Pulse Rate 02/23/22 2044 90     Resp 02/23/22 2044 16     Temp 02/23/22 2044 98.1 F (36.7 C)     Temp Source 02/23/22 2044 Oral     SpO2 02/23/22 2044 100 %     Weight 02/23/22 2045 215 lb (97.5 kg)     Height 02/23/22 2045 5\' 4"  (1.626 m)     Head Circumference --      Peak Flow --      Pain Score 02/23/22 2045 8      Pain Loc --      Pain Edu? --      Excl. in GC? --     Constitutional: Alert and oriented. Well appearing and in no acute distress. Eyes: Conjunctivae are normal. PERRL. EOMI. Head: Atraumatic. Nose: No congestion/rhinnorhea. Mouth/Throat: Mucous membranes are moist.  Oropharynx non-erythematous. Neck: No stridor.   Cardiovascular: Normal rate, regular rhythm. Grossly normal heart sounds.  Good peripheral circulation. Respiratory: Normal respiratory effort.  No retractions. Lungs CTAB. Gastrointestinal: Soft and nontender. No distention. No abdominal bruits. No CVA tenderness. Musculoskeletal: Positive for right hip pain.  Patient is tender to palpation over the right hip.  She has negative straight leg raise.  Pain is endorsed with medial rotation of the hip.  There is no redness/swelling/bony abnormality noted to the hip. Neurologic:  Normal speech and language. No gross focal neurologic deficits are appreciated. No gait instability. Skin:  Skin is warm, dry and intact. No rash noted. Psychiatric: Mood and affect are normal. Speech and behavior are normal.  ____________________________________________   LABS (all labs ordered are listed, but only abnormal results are displayed)  Labs Reviewed - No data to display ____________________________________________  EKG   ____________________________________________  RADIOLOGY  ED MD interpretation: X-ray of right hip is reviewed by me and read by radiologist.  Official radiology report(s): DG Hip Unilat W or Wo Pelvis 2-3 Views Right  Result Date: 02/23/2022 CLINICAL DATA:  Right hip pain since this morning, history of osteoarthritis EXAM: DG HIP (WITH OR WITHOUT PELVIS) 2-3V RIGHT COMPARISON:  CT 01/12/2019 FINDINGS: Advanced left and moderate right osteoarthritis of the hips. No evidence of acute fracture. No dislocation. IMPRESSION: No acute fracture of the right hip. Moderate osteoarthritis of the right hip. Electronically  Signed   By: 01/14/2019 M.D.   On: 02/23/2022 21:52    ____________________________________________   PROCEDURES  Procedure(s) performed: None  Procedures  Critical Care performed: No  ____________________________________________   INITIAL IMPRESSION / ASSESSMENT AND PLAN / ED COURSE   Wyatt S Hugh is a 46 y.o. female forced the emergency room for complaint of right hip pain.  Patient reports that she does have a history/diagnosis of osteoarthritis in bilateral hips. Patient reports that for the past 3 days she has had significant amount of pain in her right hip.  However over the last 24 hours the pain has become increasingly worse.  She reports the pain is now so severe that she is unable to sit/stand for any period of time. She denies any injury/falls.  She denies doing anything different from her normal daily activities at home or at work. Patient reports that shortly after waking 3 days ago the pain began for no reason.  Patient is able to bear weight/stand/walk with minimal difficulty while in the emergency room.  Patient's gait is stable. However, she reports that she is having difficulty bearing weight and walking at home. Patient is tender to palpation over the right hip.  She has negative straight leg raise.  Pain is induced with medial rotation of the hip.  There is no redness/swelling noted to the hip.  There is no bony abnormality noted to the hip.  I will order x-ray of the right hip. I will also order IM injection of Toradol.  X-ray of right hip shows moderate osteoarthritis.  Based on reassuring exam and reassuring x-ray, I will discharge patient home in stable condition at this time. I will provide her with a prescription for meloxicam. She will follow-up with her PCP.      ____________________________________________   FINAL CLINICAL IMPRESSION(S) / ED DIAGNOSES  Final diagnoses:  Right hip pain  Osteoarthritis of right hip, unspecified  osteoarthritis type     ED Discharge Orders          Ordered    meloxicam (MOBIC) 15 MG tablet  Daily        02/23/22 2245             Note:  This document was prepared using Dragon voice recognition software and may include unintentional dictation errors.     Herschell Dimes, NP 02/23/22 2246    Merwyn Katos, MD 02/24/22 [phone]

## 2022-02-23 NOTE — Discharge Instructions (Signed)
You have been seen today in the emergency room for right hip pain. Your x-ray has shown that you have moderate osteoarthritis, which you are already aware of. I will be sending in a prescription for meloxicam that you can take daily for the pain. I encourage you to follow-up with your primary care provider.

## 2022-02-23 NOTE — ED Notes (Signed)
R hip pain began at 1000 this morning. Denies any known injury. Pain rated 8/10. Pt ambulatory to [facility] from lobby.

## 2022-02-23 NOTE — ED Triage Notes (Signed)
Pt states history of OA in bilateral hips and complains of right hip pain. Pt denies known fever, denies known injury. Pt is ambulatory and appears in no acute distress.

## 2022-02-25 ENCOUNTER — Encounter: Payer: Self-pay | Admitting: Physician Assistant

## 2022-02-26 ENCOUNTER — Ambulatory Visit: Payer: BC Managed Care – PPO | Admitting: Physician Assistant

## 2022-02-26 ENCOUNTER — Ambulatory Visit: Payer: Self-pay

## 2022-02-26 DIAGNOSIS — R1013 Epigastric pain: Secondary | ICD-10-CM | POA: Diagnosis not present

## 2022-02-26 DIAGNOSIS — R1010 Upper abdominal pain, unspecified: Secondary | ICD-10-CM | POA: Diagnosis not present

## 2022-02-26 DIAGNOSIS — I10 Essential (primary) hypertension: Secondary | ICD-10-CM | POA: Diagnosis not present

## 2022-02-26 DIAGNOSIS — R1011 Right upper quadrant pain: Secondary | ICD-10-CM | POA: Diagnosis not present

## 2022-02-26 DIAGNOSIS — R10816 Epigastric abdominal tenderness: Secondary | ICD-10-CM | POA: Diagnosis not present

## 2022-02-26 NOTE — Telephone Encounter (Signed)
Spoke with patient and informed her that Erin Mecum PA-C that she was scheduled to see today has recommended that she go to the ER for eval for possible gallbladder issue. Patient verbalized understanding and will go to ER for eval.

## 2022-02-26 NOTE — Telephone Encounter (Signed)
Chief Complaint: Upper abdominal pain Symptoms: Pain with eating or drinking Frequency: Saturday Pertinent Negatives: Patient denies vomiting Disposition: [] ED /[] Urgent Care (no appt availability in office) / [x] Appointment(In office/virtual)/ []  [facility]/ [] Home Care/ [] Refused Recommended Disposition /[] [place] [facility]/ []  Follow-up with PCP Additional Notes: Pt has had intermittent stomach pain since Saturday. Pt states that pain starts when she eats or drinks and slowly resolves. Pain does move around to center of back. Pain of 8/10   Reason for Disposition  [1] MODERATE pain (e.g., interferes with normal activities) AND [2] comes and goes (cramps) AND [3] present > 24 hours  (Exception: Pain with Vomiting or Diarrhea - see that Guideline.)  Answer Assessment - Initial Assessment Questions 1. LOCATION: "Where does it hurt?"      Abdomen - above belly button in the middle 2. RADIATION: "Does the pain shoot anywhere else?" (e.g., chest, back)     Shoots around to back - lower middle 3. ONSET: "When did the pain begin?" (e.g., minutes, hours or days ago)      Since Saturday 4. SUDDEN: "Gradual or sudden onset?"     Sudden with eating or drinking 5. PATTERN "Does the pain come and go, or is it constant?"    - If it comes and goes: "How long does it last?" "Do you have pain now?"     (Note: Comes and goes means the pain is intermittent. It goes away completely between bouts.)    - If constant: "Is it getting better, staying the same, or getting worse?"      (Note: Constant means the pain never goes away completely; most serious pain is constant and gets worse.)      Comes and goes  6. SEVERITY: "How bad is the pain?"  (e.g., Scale 1-10; mild, moderate, or severe)    - MILD (1-3): Doesn't interfere with normal activities, abdomen soft and not tender to touch.     - MODERATE (4-7): Interferes with normal activities or awakens from sleep, abdomen tender to touch.      - SEVERE (8-10): Excruciating pain, doubled over, unable to do any normal activities.       8/10 7. RECURRENT SYMPTOM: "Have you ever had this type of stomach pain before?" If Yes, ask: "When was the last time?" and "What happened that time?"      no 8. CAUSE: "What do you think is causing the stomach pain?"     Unsure 9. RELIEVING/AGGRAVATING FACTORS: "What makes it better or worse?" (e.g., antacids, bending or twisting motion, bowel movement)     Not eating 10. OTHER SYMPTOMS: "Do you have any other symptoms?" (e.g., back pain, diarrhea, fever, urination pain, vomiting)       Nausea when pain hits 11. PREGNANCY: "Is there any chance you are pregnant?" "When was your last menstrual period?"       no  Answer Assessment - Initial Assessment Questions 1. LOCATION: "Where does it hurt?"      Above belly button 2. RADIATION: "Does the pain shoot anywhere else?" (e.g., chest, back)     Around to back 3. ONSET: "When did the pain begin?" (e.g., minutes, hours or days ago)      Saturday 4. SUDDEN: "Gradual or sudden onset?"     Sudden 5. PATTERN "Does the pain come and go, or is it constant?"    - If it comes and goes: "How long does it last?" "Do you have pain now?"     (  Note: Comes and goes means the pain is intermittent. It goes away completely between bouts.)    - If constant: "Is it getting better, staying the same, or getting worse?"      (Note: Constant means the pain never goes away completely; most serious pain is constant and gets worse.)      Comes and goes 6. SEVERITY: "How bad is the pain?"  (e.g., Scale 1-10; mild, moderate, or severe)    - MILD (1-3): Doesn't interfere with normal activities, abdomen soft and not tender to touch..     - MODERATE (4-7): Interferes with normal activities or awakens from sleep, abdomen tender to touch.     - SEVERE (8-10): Excruciating pain, doubled over, unable to do any normal activities.       8/10 7. RECURRENT SYMPTOM: "Have you ever had  this type of stomach pain before?" If Yes, ask: "When was the last time?" and "What happened that time?"      no 8. AGGRAVATING FACTORS: "Does anything seem to cause this pain?" (e.g., foods, stress, alcohol)     Eating or drinking 9. CARDIAC SYMPTOMS: "Do you have any of the following symptoms: chest pain, difficulty breathing, sweating, nausea?"      10. OTHER SYMPTOMS: "Do you have any other symptoms?" (e.g., back pain, diarrhea, fever, urination pain, vomiting)       Nausea with pain 11. PREGNANCY: "Is there any chance you are pregnant?" "When was your last menstrual period?"       no  Protocols used: Abdominal Pain - Female-A-AH, Abdominal Pain - Upper-A-AH

## 2022-02-26 NOTE — Telephone Encounter (Signed)
Just an FYI for appointment today.

## 2022-02-26 NOTE — Progress Notes (Deleted)
          Established Patient Office Visit  Name: Donna Odonnell   MRN: [id number]    DOB: 27-May-1976   Date:02/26/2022  Today's Provider: Jacquelin Hawking, MHS, PA-C Introduced myself to the patient as a PA-C and provided education on APPs in clinical practice.         Subjective  Chief Complaint  No chief complaint on file.   HPI   Patient Active Problem List   Diagnosis Date Noted   Primary hypertension 09/06/2021   COVID 07/13/2021   Annual physical exam 02/21/2021   Left shoulder pain 02/19/2021   Screening for cervical cancer 02/19/2021    Past Surgical History:  Procedure Laterality Date   CESAREAN SECTION     TONSILLECTOMY     TUBAL LIGATION      Family History  Problem Relation Age of Onset   Cancer Mother    COPD Mother    Breast cancer Mother 59   Breast cancer Maternal Grandmother 25    Social History   Tobacco Use   Smoking status: Every Day    Packs/day: 0.50    Types: Cigarettes   Smokeless tobacco: Never  Substance Use Topics   Alcohol use: Yes     Current Outpatient Medications:    fexofenadine (ALLEGRA ALLERGY) 180 MG tablet, Take 1 tablet (180 mg total) by mouth daily., Disp: 10 tablet, Rfl: 1   hydrochlorothiazide (HYDRODIURIL) 12.5 MG tablet, Take 1 tablet (12.5 mg total) by mouth daily. for high blood pressure, Disp: 30 tablet, Rfl: 5   meloxicam (MOBIC) 15 MG tablet, Take 1 tablet (15 mg total) by mouth daily., Disp: 15 tablet, Rfl: 0  Allergies  Allergen Reactions   Ciprofloxacin Hives    I personally reviewed {Reviewed:14835} with the patient/caregiver today.   ROS    Objective  There were no vitals filed for this visit.  There is no height or weight on file to calculate BMI.  Physical Exam   No results found for this or any previous visit (from the past 2160 hour(s)).   PHQ2/9:    07/13/2021   11:12 AM 02/19/2021   10:40 AM 02/05/2021    9:00 AM  Depression screen PHQ 2/9  Decreased Interest 0 0 0  Down,  Depressed, Hopeless 0  0  PHQ - 2 Score 0 0 0  Altered sleeping 0  0  Tired, decreased energy 0  0  Change in appetite 0  0  Feeling bad or failure about yourself  0  0  Trouble concentrating 0  0  Moving slowly or fidgety/restless 0  0  Suicidal thoughts 0  0  PHQ-9 Score 0  0  Difficult doing work/chores Not difficult at all        Fall Risk:    07/13/2021   11:12 AM 02/19/2021   10:39 AM 02/05/2021    9:00 AM  Fall Risk   Falls in the past year? 0 0 0  Number falls in past yr: 0 0 0  Injury with Fall? 0 0 0  Risk for fall due to : No Fall Risks No Fall Risks No Fall Risks  Follow up Falls evaluation completed Falls evaluation completed Falls evaluation completed      Functional Status Survey:      Assessment & Plan

## 2022-02-27 ENCOUNTER — Telehealth: Payer: Self-pay | Admitting: *Deleted

## 2022-02-27 NOTE — Telephone Encounter (Signed)
Transition Care Management Follow-up Telephone Call  Date of discharge and from where: [facility] 02-23-2022  hip pain 8-15 gallbladder How have you been since you were released from the hospital?  Feeling ok Any questions or concerns? No  Items Reviewed: Did the pt receive and understand the discharge instructions provided? Yes  Medications obtained and verified? Yes  Other? No  Any new allergies since your discharge? No  Dietary orders reviewed? Yes Do you have support at home? Yes   Home Care and Equipment/Supplies: Were home health services ordered?  If so, what is the name of the agency?   Has the agency set up a time to come to the patient's home?  Were any new equipment or medical supplies ordered?   What is the name of the medical supply agency?  Were you able to get the supplies/equipment?  Do you have any questions related to the use of the equipment or supplies?   Functional Questionnaire: (I = Independent and D = Dependent) ADLs: I  Bathing/Dressing- i  Meal Prep- i  Eating- i  Maintaining continence- i  Transferring/Ambulation- i  Managing Meds- i  Follow up appointments reviewed:  PCP Hospital f/u appt confirmed? yes Scheduled to see 03-04-2022 @ 10:00  Specialist Hospital f/u appt confirmed? No  . Are transportation arrangements needed? No  If their condition worsens, is the pt aware to call PCP or go to the Emergency Dept.? Yes Was the patient provided with contact information for the PCP's office or ED? Yes Was to pt encouraged to call back with questions or concerns? Yes

## 2022-03-01 ENCOUNTER — Encounter: Payer: Self-pay | Admitting: Physician Assistant

## 2022-03-01 DIAGNOSIS — R1013 Epigastric pain: Secondary | ICD-10-CM | POA: Diagnosis not present

## 2022-03-04 ENCOUNTER — Ambulatory Visit (INDEPENDENT_AMBULATORY_CARE_PROVIDER_SITE_OTHER): Payer: BC Managed Care – PPO | Admitting: Physician Assistant

## 2022-03-04 ENCOUNTER — Encounter: Payer: Self-pay | Admitting: Internal Medicine

## 2022-03-04 ENCOUNTER — Ambulatory Visit: Payer: BC Managed Care – PPO | Admitting: Physician Assistant

## 2022-03-04 ENCOUNTER — Encounter: Payer: Self-pay | Admitting: Physician Assistant

## 2022-03-04 VITALS — BP 138/81 | HR 81 | Temp 98.8°F | Wt 224.0 lb

## 2022-03-04 DIAGNOSIS — R1013 Epigastric pain: Secondary | ICD-10-CM | POA: Diagnosis not present

## 2022-03-04 DIAGNOSIS — N898 Other specified noninflammatory disorders of vagina: Secondary | ICD-10-CM

## 2022-03-04 DIAGNOSIS — L700 Acne vulgaris: Secondary | ICD-10-CM

## 2022-03-04 DIAGNOSIS — I10 Essential (primary) hypertension: Secondary | ICD-10-CM | POA: Diagnosis not present

## 2022-03-04 LAB — WET PREP FOR TRICH, YEAST, CLUE
Clue Cell Exam: NEGATIVE
Trichomonas Exam: NEGATIVE
Yeast Exam: NEGATIVE

## 2022-03-04 NOTE — Assessment & Plan Note (Signed)
Chronic, historic condition  Currently taking HCTZ 12.5 mg pO QD and appears to be tolerating well Continue current medications Recommend diet and exercise to augment medication and improve overall CV health Follow up in 6 months for monitoring and labs

## 2022-03-04 NOTE — Progress Notes (Signed)
Established Patient Office Visit  Name: Donna Odonnell   MRN: [id number]    DOB: 03/01/1976   Date:03/04/2022  Today's Provider: Jacquelin Hawking, MHS, PA-C Introduced myself to the patient as a PA-C and provided education on APPs in clinical practice.         Subjective  Chief Complaint  Chief Complaint  Patient presents with   ER Follow Up    Pt states she was seen at the ER for stomach pain, would like to discuss lab results from the ER visit if possible    Hypertension   Mass    Pt states she has small bump under her R armpit she would like looked at. States she noticed it a few weeks ago and it has not grown since she found it.     Hypertension Pertinent negatives include no blurred vision, chest pain, headaches or palpitations.    Hypertension: - Medications: HCTZ 12.5 mg PO QD  - Compliance: excellent  - Checking BP at home: occasionally checking  it at home.  - Denies any SOB, CP, vision changes, LE edema, medication SEs, or symptoms of hypotension   ER Follow up for epigastric pain Reviewed CBC, CMP, [place] results from ER visit Patient was instructed to treat with PPI to assist with gastritis  Reports she has been taking Prilosec and symptoms are improving  Reports some concerns over her UA and urine culture results- reviewed these with her and explained "normal urogenital flora"  She states she has a hx of nonsymptomatic BV and would like to rule this out today  Mass in Right axilla  Examined the area  Found open comedone  No underlying lymphadenopathy palpated    Patient Active Problem List   Diagnosis Date Noted   Primary hypertension 09/06/2021   COVID 07/13/2021   Annual physical exam 02/21/2021   Left shoulder pain 02/19/2021   Screening for cervical cancer 02/19/2021    Past Surgical History:  Procedure Laterality Date   CESAREAN SECTION     TONSILLECTOMY     TUBAL LIGATION      Family History  Problem Relation Age of Onset   Cancer  Mother    COPD Mother    Breast cancer Mother 92   Breast cancer Maternal Grandmother 48    Social History   Tobacco Use   Smoking status: Every Day    Packs/day: 0.50    Types: Cigarettes   Smokeless tobacco: Never  Substance Use Topics   Alcohol use: Yes    Comment: on occasion     Current Outpatient Medications:    hydrochlorothiazide (HYDRODIURIL) 12.5 MG tablet, Take 1 tablet (12.5 mg total) by mouth daily. for high blood pressure, Disp: 30 tablet, Rfl: 5   meloxicam (MOBIC) 15 MG tablet, Take 1 tablet (15 mg total) by mouth daily., Disp: 15 tablet, Rfl: 0   omeprazole (PRILOSEC) 20 MG capsule, Take 20 mg by mouth daily., Disp: , Rfl:   Allergies  Allergen Reactions   Ciprofloxacin Hives    I personally reviewed active problem list, medication list, allergies, notes from last encounter, lab results with the patient/caregiver today.   Review of Systems  Eyes:  Negative for blurred vision and double vision.  Cardiovascular:  Negative for chest pain, palpitations and leg swelling.  Gastrointestinal:  Negative for blood in stool, constipation, diarrhea, heartburn, nausea and vomiting.  Genitourinary:  Negative for dysuria, frequency and hematuria.  Musculoskeletal:  Positive for  joint pain and myalgias.  Neurological:  Negative for dizziness and headaches.      Objective  Vitals:   03/04/22 0958  BP: 138/81  Pulse: 81  Temp: 98.8 F (37.1 C)  TempSrc: Oral  SpO2: 99%  Weight: 224 lb (101.6 kg)    Body mass index is 38.45 kg/m.  Physical Exam Vitals reviewed.  Constitutional:      General: She is awake.     Appearance: Normal appearance. She is well-developed and well-groomed.  HENT:     Head: Normocephalic and atraumatic.  Cardiovascular:     Rate and Rhythm: Normal rate and regular rhythm.     Pulses: Normal pulses.     Heart sounds: Normal heart sounds. No murmur heard.    No friction rub. No gallop.  Pulmonary:     Effort: Pulmonary effort  is normal.     Breath sounds: Normal breath sounds. No decreased air movement. No decreased breath sounds, wheezing, rhonchi or rales.  Abdominal:     General: Bowel sounds are normal.     Palpations: Abdomen is soft.     Tenderness: There is no abdominal tenderness. There is no guarding or rebound. Negative signs include Murphy's sign and McBurney's sign.  Musculoskeletal:     Cervical back: Normal range of motion and neck supple.     Right lower leg: No edema.     Left lower leg: No edema.  Skin:    General: Skin is warm.     Findings: Acne present.     Comments: Open comedone in right axilla   Neurological:     Mental Status: She is alert.  Psychiatric:        Attention and Perception: Attention and perception normal.        Mood and Affect: Mood and affect normal.        Speech: Speech normal.        Behavior: Behavior normal. Behavior is cooperative.      No results found for this or any previous visit (from the past 2160 hour(s)).   PHQ2/9:    03/04/2022   10:11 AM 07/13/2021   11:12 AM 02/19/2021   10:40 AM 02/05/2021    9:00 AM  Depression screen PHQ 2/9  Decreased Interest 0 0 0 0  Down, Depressed, Hopeless 0 0  0  PHQ - 2 Score 0 0 0 0  Altered sleeping 0 0  0  Tired, decreased energy 0 0  0  Change in appetite 0 0  0  Feeling bad or failure about yourself  0 0  0  Trouble concentrating 0 0  0  Moving slowly or fidgety/restless 0 0  0  Suicidal thoughts 0 0  0  PHQ-9 Score 0 0  0  Difficult doing work/chores Not difficult at all Not difficult at all        Fall Risk:    03/04/2022   10:11 AM 07/13/2021   11:12 AM 02/19/2021   10:39 AM 02/05/2021    9:00 AM  Fall Risk   Falls in the past year? 0 0 0 0  Number falls in past yr: 0 0 0 0  Injury with Fall? 0 0 0 0  Risk for fall due to : No Fall Risks No Fall Risks No Fall Risks No Fall Risks  Follow up Falls evaluation completed Falls evaluation completed Falls evaluation completed Falls evaluation  completed      Functional Status Survey:  Assessment & Plan  Problem List Items Addressed This Visit       Cardiovascular and Mediastinum   Primary hypertension    Chronic, historic condition  Currently taking HCTZ 12.5 mg pO QD and appears to be tolerating well Continue current medications Recommend diet and exercise to augment medication and improve overall CV health Follow up in 6 months for monitoring and labs         Visit Diagnoses     Epigastric pain    -  Primary Acute, new concern, appears to be resolving Patient was advised to seek care at ED due to 8/10 pain with PO intake - ED demonstrates likely gastritis She has been taking Prilosec as directed for this and reports improvement in symptoms Reviewed care and prevention of gastritis- avoid trigger foods, bland diet and PPI until resolved, then gradual return to normal diet as tolerated Follow up for persistent or progressing concerns    Open comedone     Acute, new concern Reports small bump in right axilla Visual inspection revealed open comedone Recommend gentle cleansing with warm water and regular soap, avoid manipulating or irritating the area Follow up as needed     Vaginal discharge     Acute, recurrent concern Reports she has had a hx of BV in the past She would like swab to check for this today Wet prep ordered to rule out- results to dictate further management  Follow up as needed     Relevant Orders   WET PREP FOR TRICH, YEAST, CLUE        Return in about 6 months (around 09/04/2022) for HTN.   I, Otniel Hoe E Durwin Davisson, PA-C, have reviewed all documentation for this visit. The documentation on 03/04/22 for the exam, diagnosis, procedures, and orders are all accurate and complete.   Jacquelin Hawking, MHS, PA-C [facility]

## 2022-03-06 ENCOUNTER — Ambulatory Visit: Payer: BC Managed Care – PPO | Admitting: Internal Medicine

## 2022-03-06 DIAGNOSIS — M160 Bilateral primary osteoarthritis of hip: Secondary | ICD-10-CM | POA: Diagnosis not present

## 2022-04-02 ENCOUNTER — Other Ambulatory Visit: Payer: Self-pay | Admitting: Surgery

## 2022-04-08 ENCOUNTER — Encounter
Admission: RE | Admit: 2022-04-08 | Discharge: 2022-04-08 | Disposition: A | Discharge status: Home / Self Care | Payer: BC Managed Care – PPO | Source: Ambulatory Visit | Attending: Surgery | Admitting: Surgery

## 2022-04-08 VITALS — BP 125/85 | HR 76 | Resp 14 | Ht 64.0 in | Wt 221.1 lb

## 2022-04-08 DIAGNOSIS — I10 Essential (primary) hypertension: Secondary | ICD-10-CM | POA: Diagnosis not present

## 2022-04-08 DIAGNOSIS — Z01812 Encounter for preprocedural laboratory examination: Secondary | ICD-10-CM | POA: Insufficient documentation

## 2022-04-08 DIAGNOSIS — Z79899 Other long term (current) drug therapy: Secondary | ICD-10-CM

## 2022-04-08 DIAGNOSIS — Z01818 Encounter for other preprocedural examination: Secondary | ICD-10-CM

## 2022-04-08 HISTORY — DX: COVID-19: U07.1

## 2022-04-08 HISTORY — DX: Tobacco use: Z72.0

## 2022-04-08 HISTORY — DX: Gastro-esophageal reflux disease without esophagitis: K21.9

## 2022-04-08 LAB — BASIC METABOLIC PANEL
Anion gap: 10 (ref 5–15)
BUN: 16 mg/dL (ref 6–20)
CO2: 24 mmol/L (ref 22–32)
Calcium: 8.9 mg/dL (ref 8.9–10.3)
Chloride: 108 mmol/L (ref 98–111)
Creatinine, Ser: 0.89 mg/dL (ref 0.44–1.00)
GFR, Estimated: >60 mL/min (ref >60)
Glucose, Bld: 86 mg/dL (ref 70–99)
Potassium: 4.3 mmol/L (ref 3.5–5.1)
Sodium: 142 mmol/L (ref 135–145)

## 2022-04-08 LAB — URINALYSIS, ROUTINE W REFLEX MICROSCOPIC
Bacteria, UA: NONE SEEN
Bilirubin Urine: NEGATIVE
Glucose, UA: NEGATIVE mg/dL
Ketones, ur: NEGATIVE mg/dL
Leukocytes,Ua: NEGATIVE
Nitrite: NEGATIVE
Protein, ur: 30 mg/dL — AB
Specific Gravity, Urine: 1.031 — ABNORMAL HIGH (ref 1.005–1.030)
pH: 6 (ref 5.0–8.0)

## 2022-04-08 LAB — SURGICAL PCR SCREEN
MRSA, PCR: NEGATIVE
Staphylococcus aureus: NEGATIVE

## 2022-04-08 LAB — TYPE AND SCREEN
ABO/RH(D): A POS
Antibody Screen: NEGATIVE

## 2022-04-08 NOTE — Patient Instructions (Addendum)
Your procedure is scheduled on:04-18-22 Thursday Report to the Registration Desk on the 1st floor of the [facility].Then proceed to the 2nd floor Surgery Desk To find out your arrival time, please call [phone] between 1PM - 3PM on:04-17-22 Wednesday If your arrival time is 6:00 am, do not arrive prior to that time as the [facility] entrance doors do not open until 6:00 am.  REMEMBER: Instructions that are not followed completely may result in serious medical risk, up to and including death; or upon the discretion of your surgeon and anesthesiologist your surgery may need to be rescheduled.  Do not eat food after midnight the night before surgery.  No gum chewing, lozengers or hard candies.  You may however, drink CLEAR liquids up to 2 hours before you are scheduled to arrive for your surgery. Do not drink anything within 2 hours of your scheduled arrival time.  Clear liquids include: - water  - apple juice without pulp - gatorade (not RED colors) - black coffee or tea (Do NOT add milk or creamers to the coffee or tea) Do NOT drink anything that is not on this list.  In addition, your doctor has ordered for you to drink the provided  Ensure Pre-Surgery Clear Carbohydrate Drink  Drinking this carbohydrate drink up to two hours before surgery helps to reduce insulin resistance and improve patient outcomes. Please complete drinking 2 hours prior to scheduled arrival time.  TAKE THESE MEDICATIONS THE MORNING OF SURGERY WITH A SIP OF WATER: -omeprazole (PRILOSEC)-take one the night before surgery and one the morning of surgery  One week prior to surgery: Stop Anti-inflammatories (NSAIDS) such as Meloxicam (Mobic),  Advil, Aleve, Ibuprofen, Motrin, Naproxen, Naprosyn and Aspirin based products such as Excedrin, Goodys Powder, BC Powder.You may however, continue to take Tylenol if needed for pain up until the day of surgery.  Stop ANY OVER THE COUNTER supplements/vitamins 7 days prior  to surgery   No Alcohol for 24 hours before or after surgery.  No Smoking including e-cigarettes for 24 hours prior to surgery.  No chewable tobacco products for at least 6 hours prior to surgery.  No nicotine patches on the day of surgery.  Do not use any "recreational" drugs for at least a week prior to your surgery.  Please be advised that the combination of cocaine and anesthesia may have negative outcomes, up to and including death. If you test positive for cocaine, your surgery will be cancelled.  On the morning of surgery brush your teeth with toothpaste and water, you may rinse your mouth with mouthwash if you wish. Do not swallow any toothpaste or mouthwash.  Use CHG Soap as directed on instruction sheet.  Do not wear jewelry, make-up, hairpins, clips or nail polish.  Do not wear lotions, powders, or perfumes.   Do not shave body from the neck down 48 hours prior to surgery just in case you cut yourself which could leave a site for infection.  Also, freshly shaved skin may become irritated if using the CHG soap.  Contact lenses, hearing aids and dentures may not be worn into surgery.  Do not bring valuables to the hospital. [facility] is not responsible for any missing/lost belongings or valuables.   Notify your doctor if there is any change in your medical condition (cold, fever, infection).  Wear comfortable clothing (specific to your surgery type) to the hospital.  After surgery, you can help prevent lung complications by doing breathing exercises.  Take deep breaths  and cough every 1-2 hours. Your doctor may order a device called an Incentive Spirometer to help you take deep breaths. When coughing or sneezing, hold a pillow firmly against your incision with both hands. This is called "splinting." Doing this helps protect your incision. It also decreases belly discomfort.  If you are being admitted to the hospital overnight, leave your suitcase in the car. After  surgery it may be brought to your room.  If you are being discharged the day of surgery, you will not be allowed to drive home. You will need a responsible adult (18 years or older) to drive you home and stay with you that night.   If you are taking public transportation, you will need to have a responsible adult (18 years or older) with you. Please confirm with your physician that it is acceptable to use public transportation.   Please call the [facility] at [phone] if you have any questions about these instructions.  Surgery Visitation Policy:  Patients undergoing a surgery or procedure may have two family members or support persons with them as long as the person is not COVID-19 positive or experiencing its symptoms.   Inpatient Visitation:    Visiting hours are 7 a.m. to 8 p.m. Up to four visitors are allowed at one time in a patient room, including children. The visitors may rotate out with other people during the day. One designated support person (adult) may remain overnight.    How to Use an Incentive Spirometer An incentive spirometer is a tool that measures how well you are filling your lungs with each breath. Learning to take long, deep breaths using this tool can help you keep your lungs clear and active. This may help to reverse or lessen your chance of developing breathing (pulmonary) problems, especially infection. You may be asked to use a spirometer: After a surgery. If you have a lung problem or a history of smoking. After a long period of time when you have been unable to move or be active. If the spirometer includes an indicator to show the highest number that you have reached, your health care provider or respiratory therapist will help you set a goal. Keep a log of your progress as told by your health care provider. What are the risks? Breathing too quickly may cause dizziness or cause you to pass out. Take your time so you do not get dizzy or  light-headed. If you are in pain, you may need to take pain medicine before doing incentive spirometry. It is harder to take a deep breath if you are having pain. How to use your incentive spirometer  Sit up on the edge of your bed or on a chair. Hold the incentive spirometer so that it is in an upright position. Before you use the spirometer, breathe out normally. Place the mouthpiece in your mouth. Make sure your lips are closed tightly around it. Breathe in slowly and as deeply as you can through your mouth, causing the piston or the ball to rise toward the top of the chamber. Hold your breath for 3-5 seconds, or for as long as possible. If the spirometer includes a coach indicator, use this to guide you in breathing. Slow down your breathing if the indicator goes above the marked areas. Remove the mouthpiece from your mouth and breathe out normally. The piston or ball will return to the bottom of the chamber. Rest for a few seconds, then repeat the steps 10 or more  times. Take your time and take a few normal breaths between deep breaths so that you do not get dizzy or light-headed. Do this every 1-2 hours when you are awake. If the spirometer includes a goal marker to show the highest number you have reached (best effort), use this as a goal to work toward during each repetition. After each set of 10 deep breaths, cough a few times. This will help to make sure that your lungs are clear. If you have an incision on your chest or abdomen from surgery, place a pillow or a rolled-up towel firmly against the incision when you cough. This can help to reduce pain while taking deep breaths and coughing. General tips When you are able to get out of bed: Walk around often. Continue to take deep breaths and cough in order to clear your lungs. Keep using the incentive spirometer until your health care provider says it is okay to stop using it. If you have been in the hospital, you may be told to keep  using the spirometer at home. Contact a health care provider if: You are having difficulty using the spirometer. You have trouble using the spirometer as often as instructed. Your pain medicine is not giving enough relief for you to use the spirometer as told. You have a fever. Get help right away if: You develop shortness of breath. You develop a cough with bloody mucus from the lungs. You have fluid or blood coming from an incision site after you cough. Summary An incentive spirometer is a tool that can help you learn to take long, deep breaths to keep your lungs clear and active. You may be asked to use a spirometer after a surgery, if you have a lung problem or a history of smoking, or if you have been inactive for a long period of time. Use your incentive spirometer as instructed every 1-2 hours while you are awake. If you have an incision on your chest or abdomen, place a pillow or a rolled-up towel firmly against your incision when you cough. This will help to reduce pain. Get help right away if you have shortness of breath, you cough up bloody mucus, or blood comes from your incision when you cough. This information is not intended to replace advice given to you by your health care provider. Make sure you discuss any questions you have with your health care provider. Document Revised: 09/20/2019 Document Reviewed: 09/20/2019 Elsevier Patient Education  [employer].

## 2022-04-10 DIAGNOSIS — M1611 Unilateral primary osteoarthritis, right hip: Secondary | ICD-10-CM | POA: Diagnosis not present

## 2022-04-10 DIAGNOSIS — Z6836 Body mass index (BMI) 36.0-36.9, adult: Secondary | ICD-10-CM | POA: Diagnosis not present

## 2022-04-12 ENCOUNTER — Other Ambulatory Visit: Payer: Self-pay | Admitting: Nurse Practitioner

## 2022-04-12 DIAGNOSIS — Z1231 Encounter for screening mammogram for malignant neoplasm of breast: Secondary | ICD-10-CM

## 2022-04-18 ENCOUNTER — Ambulatory Visit: Payer: BC Managed Care – PPO | Admitting: Urgent Care

## 2022-04-18 ENCOUNTER — Encounter
Admission: RE | Disposition: A | Discharge status: Home / Self Care | Payer: Self-pay | Source: Home / Self Care | Attending: Surgery

## 2022-04-18 ENCOUNTER — Other Ambulatory Visit: Payer: Self-pay

## 2022-04-18 ENCOUNTER — Encounter: Payer: Self-pay | Admitting: Surgery

## 2022-04-18 ENCOUNTER — Observation Stay
Admission: RE | Admit: 2022-04-18 | Discharge: 2022-04-19 | Disposition: A | Discharge status: Home / Self Care | Payer: BC Managed Care – PPO | Attending: Surgery | Admitting: Surgery

## 2022-04-18 ENCOUNTER — Ambulatory Visit: Payer: BC Managed Care – PPO | Admitting: Anesthesiology

## 2022-04-18 ENCOUNTER — Ambulatory Visit: Payer: BC Managed Care – PPO

## 2022-04-18 DIAGNOSIS — M1611 Unilateral primary osteoarthritis, right hip: Secondary | ICD-10-CM | POA: Diagnosis not present

## 2022-04-18 DIAGNOSIS — I10 Essential (primary) hypertension: Secondary | ICD-10-CM | POA: Diagnosis not present

## 2022-04-18 DIAGNOSIS — Z6836 Body mass index (BMI) 36.0-36.9, adult: Secondary | ICD-10-CM | POA: Diagnosis not present

## 2022-04-18 DIAGNOSIS — Z79899 Other long term (current) drug therapy: Secondary | ICD-10-CM | POA: Diagnosis not present

## 2022-04-18 DIAGNOSIS — Z96641 Presence of right artificial hip joint: Secondary | ICD-10-CM

## 2022-04-18 DIAGNOSIS — Z91040 Latex allergy status: Secondary | ICD-10-CM | POA: Insufficient documentation

## 2022-04-18 DIAGNOSIS — Z01818 Encounter for other preprocedural examination: Secondary | ICD-10-CM

## 2022-04-18 DIAGNOSIS — Z8616 Personal history of COVID-19: Secondary | ICD-10-CM | POA: Diagnosis not present

## 2022-04-18 DIAGNOSIS — F17210 Nicotine dependence, cigarettes, uncomplicated: Secondary | ICD-10-CM | POA: Diagnosis not present

## 2022-04-18 DIAGNOSIS — Z471 Aftercare following joint replacement surgery: Secondary | ICD-10-CM | POA: Diagnosis not present

## 2022-04-18 HISTORY — PX: TOTAL HIP ARTHROPLASTY: SHX124

## 2022-04-18 LAB — ABO/RH: ABO/RH(D): A POS

## 2022-04-18 LAB — POCT PREGNANCY, URINE: Preg Test, Ur: NEGATIVE

## 2022-04-18 SURGERY — ARTHROPLASTY, HIP, TOTAL,POSTERIOR APPROACH
Anesthesia: Spinal | Site: Hip | Laterality: Right

## 2022-04-18 MED ORDER — FLEET ENEMA 7-19 GM/118ML RE ENEM: 1.0000 | ENEMA | Freq: Once | RECTAL | Status: DC | PRN

## 2022-04-18 MED ORDER — APIXABAN 2.5 MG PO TABS: 2.5000 mg | ORAL_TABLET | Freq: Two times a day (BID) | ORAL | 0 refills | Status: DC

## 2022-04-18 MED ORDER — DIPHENHYDRAMINE HCL 12.5 MG/5ML PO ELIX: 12.5000 mg | ORAL_SOLUTION | ORAL | Status: DC | PRN

## 2022-04-18 MED ORDER — ACETAMINOPHEN 325 MG PO TABS: 325.0000 mg | ORAL_TABLET | Freq: Four times a day (QID) | ORAL | Status: DC | PRN

## 2022-04-18 MED ORDER — HYDROCHLOROTHIAZIDE 12.5 MG PO TABS
12.5000 mg | ORAL_TABLET | Freq: Every day | ORAL | Status: DC
  Administered 2022-04-19: 12.5 mg via ORAL
  Filled 2022-04-18: qty 1

## 2022-04-18 MED ORDER — SODIUM CHLORIDE 0.9 % BOLUS PEDS
250.0000 mL | Freq: Once | INTRAVENOUS | Status: AC
  Administered 2022-04-18: 250 mL via INTRAVENOUS

## 2022-04-18 MED ORDER — BUPIVACAINE HCL (PF) 0.5 % IJ SOLN
INTRAMUSCULAR | Status: AC
  Filled 2022-04-18: qty 10

## 2022-04-18 MED ORDER — MIDAZOLAM HCL 5 MG/5ML IJ SOLN
INTRAMUSCULAR | Status: DC | PRN
  Administered 2022-04-18: 2 mg via INTRAVENOUS

## 2022-04-18 MED ORDER — BUPIVACAINE-EPINEPHRINE (PF) 0.5% -1:200000 IJ SOLN
INTRAMUSCULAR | Status: AC
  Filled 2022-04-18: qty 30

## 2022-04-18 MED ORDER — ONDANSETRON HCL 4 MG/2ML IJ SOLN
INTRAMUSCULAR | Status: AC
  Filled 2022-04-18: qty 2

## 2022-04-18 MED ORDER — DEXAMETHASONE SODIUM PHOSPHATE 10 MG/ML IJ SOLN
INTRAMUSCULAR | Status: AC
  Filled 2022-04-18: qty 1

## 2022-04-18 MED ORDER — BUPIVACAINE HCL (PF) 0.5 % IJ SOLN
INTRAMUSCULAR | Status: DC | PRN
  Administered 2022-04-18: 2.8 mL via INTRATHECAL

## 2022-04-18 MED ORDER — PHENYLEPHRINE HCL-NACL 20-0.9 MG/250ML-% IV SOLN
INTRAVENOUS | Status: DC | PRN
  Administered 2022-04-18: 80 ug via INTRAVENOUS
  Administered 2022-04-18: 40 ug/min via INTRAVENOUS

## 2022-04-18 MED ORDER — TRANEXAMIC ACID 1000 MG/10ML IV SOLN
INTRAVENOUS | Status: AC
  Filled 2022-04-18: qty 10

## 2022-04-18 MED ORDER — METOCLOPRAMIDE HCL 5 MG PO TABS: 5.0000 mg | ORAL_TABLET | Freq: Three times a day (TID) | ORAL | Status: DC | PRN

## 2022-04-18 MED ORDER — SODIUM CHLORIDE 0.9 % IV SOLN: INTRAVENOUS | Status: DC

## 2022-04-18 MED ORDER — METOCLOPRAMIDE HCL 5 MG/ML IJ SOLN: 5.0000 mg | Freq: Three times a day (TID) | INTRAMUSCULAR | Status: DC | PRN

## 2022-04-18 MED ORDER — OXYCODONE HCL 5 MG PO TABS: 5.0000 mg | ORAL_TABLET | ORAL | Status: DC | PRN

## 2022-04-18 MED ORDER — ACETAMINOPHEN 500 MG PO TABS
1000.0000 mg | ORAL_TABLET | Freq: Four times a day (QID) | ORAL | Status: DC
  Administered 2022-04-18 – 2022-04-19 (×3): 1000 mg via ORAL
  Filled 2022-04-18 (×3): qty 2

## 2022-04-18 MED ORDER — BUPIVACAINE LIPOSOME 1.3 % IJ SUSP
INTRAMUSCULAR | Status: AC
  Filled 2022-04-18: qty 20

## 2022-04-18 MED ORDER — OXYCODONE HCL 5 MG PO TABS
5.0000 mg | ORAL_TABLET | ORAL | Status: DC | PRN
  Administered 2022-04-18: 10 mg via ORAL
  Administered 2022-04-19: 5 mg via ORAL
  Filled 2022-04-18: qty 1
  Filled 2022-04-18: qty 2

## 2022-04-18 MED ORDER — CEFAZOLIN SODIUM-DEXTROSE 2-4 GM/100ML-% IV SOLN
2.0000 g | INTRAVENOUS | Status: AC
  Administered 2022-04-18: 2 g via INTRAVENOUS

## 2022-04-18 MED ORDER — OXYCODONE HCL 5 MG PO TABS: 5.0000 mg | ORAL_TABLET | ORAL | 0 refills | Status: DC | PRN

## 2022-04-18 MED ORDER — METOCLOPRAMIDE HCL 10 MG PO TABS: 5.0000 mg | ORAL_TABLET | Freq: Three times a day (TID) | ORAL | Status: DC | PRN

## 2022-04-18 MED ORDER — ONDANSETRON HCL 4 MG/2ML IJ SOLN: 4.0000 mg | Freq: Four times a day (QID) | INTRAMUSCULAR | Status: DC | PRN

## 2022-04-18 MED ORDER — KETOROLAC TROMETHAMINE 15 MG/ML IJ SOLN
15.0000 mg | Freq: Four times a day (QID) | INTRAMUSCULAR | Status: DC
  Administered 2022-04-18 – 2022-04-19 (×3): 15 mg via INTRAVENOUS
  Filled 2022-04-18 (×3): qty 1

## 2022-04-18 MED ORDER — HYDROMORPHONE HCL 1 MG/ML IJ SOLN
INTRAMUSCULAR | Status: AC
  Administered 2022-04-18: 0.5 mg via INTRAVENOUS
  Filled 2022-04-18: qty 1

## 2022-04-18 MED ORDER — EPHEDRINE SULFATE (PRESSORS) 50 MG/ML IJ SOLN
INTRAMUSCULAR | Status: DC | PRN
  Administered 2022-04-18: 5 mg via INTRAVENOUS
  Administered 2022-04-18: 2.5 mg via INTRAVENOUS

## 2022-04-18 MED ORDER — FENTANYL CITRATE (PF) 100 MCG/2ML IJ SOLN
INTRAMUSCULAR | Status: AC
  Filled 2022-04-18: qty 2

## 2022-04-18 MED ORDER — LIDOCAINE HCL (PF) 2 % IJ SOLN
INTRAMUSCULAR | Status: AC
  Filled 2022-04-18: qty 5

## 2022-04-18 MED ORDER — TRANEXAMIC ACID 1000 MG/10ML IV SOLN
INTRAVENOUS | Status: DC | PRN
  Administered 2022-04-18: 1000 mg via TOPICAL

## 2022-04-18 MED ORDER — KETOROLAC TROMETHAMINE 30 MG/ML IJ SOLN
INTRAMUSCULAR | Status: AC
  Filled 2022-04-18: qty 1

## 2022-04-18 MED ORDER — CHLORHEXIDINE GLUCONATE 0.12 % MT SOLN
OROMUCOSAL | Status: AC
  Administered 2022-04-18: 15 mL via OROMUCOSAL
  Filled 2022-04-18: qty 15

## 2022-04-18 MED ORDER — ONDANSETRON HCL 4 MG PO TABS: 4.0000 mg | ORAL_TABLET | Freq: Four times a day (QID) | ORAL | Status: DC | PRN

## 2022-04-18 MED ORDER — KETOROLAC TROMETHAMINE 30 MG/ML IJ SOLN
INTRAMUSCULAR | Status: AC
  Administered 2022-04-18: 30 mg via INTRAVENOUS
  Filled 2022-04-18: qty 1

## 2022-04-18 MED ORDER — KETOROLAC TROMETHAMINE 30 MG/ML IJ SOLN: 30.0000 mg | Freq: Once | INTRAMUSCULAR | Status: AC

## 2022-04-18 MED ORDER — ACETAMINOPHEN 10 MG/ML IV SOLN
INTRAVENOUS | Status: AC
  Filled 2022-04-18: qty 100

## 2022-04-18 MED ORDER — MAGNESIUM HYDROXIDE 400 MG/5ML PO SUSP: 30.0000 mL | Freq: Every day | ORAL | Status: DC | PRN

## 2022-04-18 MED ORDER — ONDANSETRON HCL 4 MG/2ML IJ SOLN
INTRAMUSCULAR | Status: DC | PRN
  Administered 2022-04-18: 4 mg via INTRAVENOUS

## 2022-04-18 MED ORDER — HYDROMORPHONE HCL 1 MG/ML IJ SOLN: 0.2500 mg | INTRAMUSCULAR | Status: DC | PRN

## 2022-04-18 MED ORDER — PROPOFOL 1000 MG/100ML IV EMUL
INTRAVENOUS | Status: AC
  Filled 2022-04-18: qty 100

## 2022-04-18 MED ORDER — OXYCODONE HCL 5 MG/5ML PO SOLN: 5.0000 mg | Freq: Once | ORAL | Status: DC | PRN

## 2022-04-18 MED ORDER — TRANEXAMIC ACID-NACL 1000-0.7 MG/100ML-% IV SOLN
INTRAVENOUS | Status: DC | PRN
  Administered 2022-04-18: 1000 mg via INTRAVENOUS

## 2022-04-18 MED ORDER — CEFAZOLIN SODIUM-DEXTROSE 2-4 GM/100ML-% IV SOLN
INTRAVENOUS | Status: AC
  Filled 2022-04-18: qty 100

## 2022-04-18 MED ORDER — CHLORHEXIDINE GLUCONATE 0.12 % MT SOLN: 15.0000 mL | Freq: Once | OROMUCOSAL | Status: AC

## 2022-04-18 MED ORDER — HYDROMORPHONE HCL 1 MG/ML IJ SOLN
0.2500 mg | INTRAMUSCULAR | Status: DC | PRN
  Administered 2022-04-18: 0.5 mg via INTRAVENOUS

## 2022-04-18 MED ORDER — SODIUM CHLORIDE FLUSH 0.9 % IV SOLN
INTRAVENOUS | Status: AC
  Filled 2022-04-18: qty 40

## 2022-04-18 MED ORDER — DOCUSATE SODIUM 100 MG PO CAPS
100.0000 mg | ORAL_CAPSULE | Freq: Two times a day (BID) | ORAL | Status: DC
  Administered 2022-04-18 – 2022-04-19 (×2): 100 mg via ORAL
  Filled 2022-04-18 (×2): qty 1

## 2022-04-18 MED ORDER — APIXABAN 2.5 MG PO TABS
2.5000 mg | ORAL_TABLET | Freq: Two times a day (BID) | ORAL | Status: DC
  Administered 2022-04-19: 2.5 mg via ORAL
  Filled 2022-04-18: qty 1

## 2022-04-18 MED ORDER — ACETAMINOPHEN 10 MG/ML IV SOLN
INTRAVENOUS | Status: DC | PRN
  Administered 2022-04-18: 1000 mg via INTRAVENOUS

## 2022-04-18 MED ORDER — PHENYLEPHRINE HCL (PRESSORS) 10 MG/ML IV SOLN
INTRAVENOUS | Status: DC | PRN
  Administered 2022-04-18: 80 ug via INTRAVENOUS

## 2022-04-18 MED ORDER — BISACODYL 10 MG RE SUPP: 10.0000 mg | Freq: Every day | RECTAL | Status: DC | PRN

## 2022-04-18 MED ORDER — DEXAMETHASONE SODIUM PHOSPHATE 10 MG/ML IJ SOLN
INTRAMUSCULAR | Status: DC | PRN
  Administered 2022-04-18: 10 mg via INTRAVENOUS

## 2022-04-18 MED ORDER — EPHEDRINE 5 MG/ML INJ
INTRAVENOUS | Status: AC
  Filled 2022-04-18: qty 5

## 2022-04-18 MED ORDER — PHENYLEPHRINE 80 MCG/ML (10ML) SYRINGE FOR IV PUSH (FOR BLOOD PRESSURE SUPPORT)
PREFILLED_SYRINGE | INTRAVENOUS | Status: AC
  Filled 2022-04-18: qty 10

## 2022-04-18 MED ORDER — TRIAMCINOLONE ACETONIDE 40 MG/ML IJ SUSP
INTRAMUSCULAR | Status: DC | PRN
  Administered 2022-04-18: 90 mL

## 2022-04-18 MED ORDER — SODIUM CHLORIDE 0.9 % BOLUS PEDS: 250.0000 mL | Freq: Once | INTRAVENOUS | Status: DC

## 2022-04-18 MED ORDER — PHENYLEPHRINE HCL-NACL 20-0.9 MG/250ML-% IV SOLN
INTRAVENOUS | Status: AC
  Filled 2022-04-18: qty 250

## 2022-04-18 MED ORDER — CEFAZOLIN SODIUM-DEXTROSE 2-4 GM/100ML-% IV SOLN
2.0000 g | Freq: Four times a day (QID) | INTRAVENOUS | Status: AC
  Administered 2022-04-18 – 2022-04-19 (×3): 2 g via INTRAVENOUS
  Filled 2022-04-18 (×3): qty 100

## 2022-04-18 MED ORDER — 0.9 % SODIUM CHLORIDE (POUR BTL) OPTIME
TOPICAL | Status: DC | PRN
  Administered 2022-04-18: 500 mL

## 2022-04-18 MED ORDER — MIDAZOLAM HCL 2 MG/2ML IJ SOLN
INTRAMUSCULAR | Status: AC
  Filled 2022-04-18: qty 2

## 2022-04-18 MED ORDER — PROPOFOL 10 MG/ML IV BOLUS
INTRAVENOUS | Status: DC | PRN
  Administered 2022-04-18: 50 mg via INTRAVENOUS
  Administered 2022-04-18: 100 ug/kg/min via INTRAVENOUS

## 2022-04-18 MED ORDER — ROCURONIUM BROMIDE 10 MG/ML (PF) SYRINGE
PREFILLED_SYRINGE | INTRAVENOUS | Status: AC
  Filled 2022-04-18: qty 10

## 2022-04-18 MED ORDER — OXYCODONE HCL 5 MG PO TABS: 5.0000 mg | ORAL_TABLET | Freq: Once | ORAL | Status: DC | PRN

## 2022-04-18 MED ORDER — TRIAMCINOLONE ACETONIDE 40 MG/ML IJ SUSP
INTRAMUSCULAR | Status: AC
  Filled 2022-04-18: qty 2

## 2022-04-18 MED ORDER — SODIUM CHLORIDE 0.9 % IR SOLN
Status: DC | PRN
  Administered 2022-04-18: 3000 mL

## 2022-04-18 MED ORDER — LACTATED RINGERS IV SOLN: INTRAVENOUS | Status: DC

## 2022-04-18 MED ORDER — ORAL CARE MOUTH RINSE: 15.0000 mL | Freq: Once | OROMUCOSAL | Status: AC

## 2022-04-18 SURGICAL SUPPLY — 56 items
BLADE SAGITTAL WIDE XTHICK NO (BLADE) ×1 IMPLANT
BLADE SURG SZ20 CARB STEEL (BLADE) ×1 IMPLANT
CHLORAPREP W/TINT 26 (MISCELLANEOUS) ×1 IMPLANT
DRAPE 3/4 80X56 (DRAPES) ×1 IMPLANT
DRAPE IMP U-DRAPE 54X76 (DRAPES) IMPLANT
DRAPE INCISE IOBAN 66X60 STRL (DRAPES) ×1 IMPLANT
DRAPE SURG 17X11 SM STRL (DRAPES) ×2 IMPLANT
DRSG MEPILEX SACRM 8.7X9.8 (GAUZE/BANDAGES/DRESSINGS) ×1 IMPLANT
DRSG OPSITE POSTOP 4X10 (GAUZE/BANDAGES/DRESSINGS) ×1 IMPLANT
ELECT CAUTERY BLADE 6.4 (BLADE) ×1 IMPLANT
GAUZE 4X4 16PLY ~~LOC~~+RFID DBL (SPONGE) ×1 IMPLANT
GAUZE XEROFORM 1X8 LF (GAUZE/BANDAGES/DRESSINGS) ×1 IMPLANT
GLOVE BIO SURGEON STRL SZ7.5 (GLOVE) ×4 IMPLANT
GLOVE BIO SURGEON STRL SZ8 (GLOVE) ×4 IMPLANT
GLOVE BIOGEL PI IND STRL 8 (GLOVE) ×1 IMPLANT
GLOVE SURG UNDER LTX SZ8 (GLOVE) ×1 IMPLANT
GOWN STRL REUS W/ TWL LRG LVL3 (GOWN DISPOSABLE) ×1 IMPLANT
GOWN STRL REUS W/ TWL XL LVL3 (GOWN DISPOSABLE) ×1 IMPLANT
GOWN STRL REUS W/TWL LRG LVL3 (GOWN DISPOSABLE) ×1
GOWN STRL REUS W/TWL XL LVL3 (GOWN DISPOSABLE) ×1
HEAD CERAMIC BIOLOX 32 TP1 -3 (Head) IMPLANT
HIP SHELL ACETAB 3H 48MM C (Hips) ×1 IMPLANT
HOLSTER ELECTROSUGICAL PENCIL (MISCELLANEOUS) ×1 IMPLANT
HOOD PEEL AWAY FLYTE STAYCOOL (MISCELLANEOUS) ×3 IMPLANT
IV NS IRRIG 3000ML ARTHROMATIC (IV SOLUTION) ×1 IMPLANT
KIT TURNOVER KIT A (KITS) ×1 IMPLANT
LINER ACE G7 32 SZC HIGH WALL (Liner) IMPLANT
MANIFOLD NEPTUNE II (INSTRUMENTS) ×1 IMPLANT
MAT ABSORB  FLUID 56X50 GRAY (MISCELLANEOUS) ×1
MAT ABSORB FLUID 56X50 GRAY (MISCELLANEOUS) ×1 IMPLANT
NDL FILTER BLUNT 18X1 1/2 (NEEDLE) ×1 IMPLANT
NDL SAFETY ECLIP 18X1.5 (MISCELLANEOUS) ×2 IMPLANT
NDL SPNL 20GX3.5 QUINCKE YW (NEEDLE) ×1 IMPLANT
NEEDLE FILTER BLUNT 18X1 1/2 (NEEDLE) ×1 IMPLANT
NEEDLE SPNL 20GX3.5 QUINCKE YW (NEEDLE) ×1 IMPLANT
PACK HIP PROSTHESIS (MISCELLANEOUS) ×1 IMPLANT
PENCIL SMOKE EVACUATOR (MISCELLANEOUS) ×1 IMPLANT
PIN STEINMAN 3/16 (PIN) ×1 IMPLANT
PIN STEINMANN 3/16X9 BAY 6PK (Pin) ×1 IMPLANT
PULSAVAC PLUS IRRIG FAN TIP (DISPOSABLE) ×1
SHELL ACETAB HIP 3H 48MM C (Hips) IMPLANT
SPONGE T-LAP 18X18 ~~LOC~~+RFID (SPONGE) ×5 IMPLANT
ST PIN 3/16X9 BAY 6PK (Pin) ×1 IMPLANT
STAPLER SKIN PROX 35W (STAPLE) ×1 IMPLANT
STEM FEM COLLARLESS 8X120X130 (Stem) IMPLANT
SUT TICRON 2-0 30IN 311381 (SUTURE) ×3 IMPLANT
SUT VIC AB 0 CT1 36 (SUTURE) ×1 IMPLANT
SUT VIC AB 1 CT1 36 (SUTURE) ×1 IMPLANT
SUT VIC AB 2-0 CT1 (SUTURE) ×3 IMPLANT
SYR 10ML LL (SYRINGE) ×1 IMPLANT
SYR 20ML LL LF (SYRINGE) ×1 IMPLANT
SYR 30ML LL (SYRINGE) ×2 IMPLANT
TAPE TRANSPORE STRL 2 31045 (GAUZE/BANDAGES/DRESSINGS) ×1 IMPLANT
TIP FAN IRRIG PULSAVAC PLUS (DISPOSABLE) ×1 IMPLANT
TRAP FLUID SMOKE EVACUATOR (MISCELLANEOUS) ×2 IMPLANT
WATER STERILE IRR 1000ML POUR (IV SOLUTION) ×1 IMPLANT

## 2022-04-18 NOTE — Anesthesia Preprocedure Evaluation (Addendum)
Anesthesia Evaluation  Patient identified by MRN, date of birth, ID band Patient awake    Reviewed: Allergy & Precautions, NPO status , Patient's Chart, lab work & pertinent test results  History of Anesthesia Complications Negative for: history of anesthetic complications  Airway Mallampati: II  TM Distance: >3 FB Neck ROM: Full    Dental no notable dental hx.    Pulmonary neg pulmonary ROS, Current Smoker and Patient abstained from smoking.,    Pulmonary exam normal breath sounds clear to auscultation       Cardiovascular hypertension, Pt. on medications negative cardio ROS Normal cardiovascular exam Rhythm:Regular Rate:Normal     Neuro/Psych negative neurological ROS  negative psych ROS   GI/Hepatic Neg liver ROS, GERD  ,  Endo/Other  negative endocrine ROS  Renal/GU negative Renal ROS  negative genitourinary   Musculoskeletal  (+) Arthritis , Osteoarthritis,    Abdominal   Peds negative pediatric ROS (+)  Hematology negative hematology ROS (+)   Anesthesia Other Findings   Reproductive/Obstetrics negative OB ROS                             Anesthesia Physical Anesthesia Plan  ASA: 2  Anesthesia Plan: Spinal   Post-op Pain Management:    Induction:   PONV Risk Score and Plan: 2 and Propofol infusion  Airway Management Planned: Natural Airway and Nasal Cannula  Additional Equipment:   Intra-op Plan:   Post-operative Plan:   Informed Consent: I have reviewed the patients History and Physical, chart, labs and discussed the procedure including the risks, benefits and alternatives for the proposed anesthesia with the patient or authorized representative who has indicated his/her understanding and acceptance.     Dental Advisory Given  Plan Discussed with: Anesthesiologist, CRNA and Surgeon  Anesthesia Plan Comments: (Patient reports no bleeding problems and no  anticoagulant use.  Plan for spinal with backup GA  Patient consented for risks of anesthesia including but not limited to:  - adverse reactions to medications - damage to eyes, teeth, lips or other oral mucosa - nerve damage due to positioning  - risk of bleeding, infection and or nerve damage from spinal that could lead to paralysis - risk of headache or failed spinal - damage to teeth, lips or other oral mucosa - sore throat or hoarseness - damage to heart, brain, nerves, lungs, other parts of body or loss of life  Patient voiced understanding.)       Anesthesia Quick Evaluation

## 2022-04-18 NOTE — H&P (Signed)
History of Present Illness: Donna Odonnell is a 46 y.o. female who presented today for history and physical for an upcoming right total hip arthroplasty to be done by Dr. Joice Lofts on April 18, 2022. The patient had seen Dr. Joice Lofts previously who presented for evaluation and treatment of her bilateral hip pain, right more symptomatic than left . The symptoms have been present for several years and developed without any apparent cause or injury. He referred her to Dr. Landry Mellow who performed a right hip injection under ultrasound guidance which provided moderate relief of her symptoms for many months before her symptoms began to recur. Therefore, the patient has been referred to me for further evaluation and treatment. The pain is moderate, worsening, and 4 on a scale of 1-10. She describes the pain as aching, dull, and throbbing. The symptoms are aggravated by standing, walking, and daily activities. The symptoms improve with sitting, lying down, acetaminophen, and rest. She denies any numbness, weakness, or paresthesias to either lower extremity. She reports no bladder, bowel or sexual dysfunction. She has tried the injection described above previously, but has not tried any formal physical therapy for her symptoms. She has been working full time and without restrictions. The patient is not a diabetic.  Current Outpatient Medications: acetaminophen (TYLENOL) 500 MG tablet Take 1,000 mg by mouth once daily as needed for Pain  hydroCHLOROthiazide (HYDRODIURIL) 25 MG tablet TAKE 1 TABLET BY MOUTH ONCE DAILY FOR HIGH BLOOD PRESSURE  omeprazole (PRILOSEC) 20 MG DR capsule Take 20 mg by mouth once daily  meloxicam (MOBIC) 15 MG tablet Take 1 tablet by mouth once daily (Patient not taking: Reported on 04/10/2022)   Allergies:   Ciprofloxacin Hives  Latex Rash   Past Medical History:  Arthritis of right hip  Hypertension  Left shoulder pain 02/19/2021   Past Surgical History:  CESAREAN SECTION 11/27/94,  04/24/98, 07/17/99  TONSILLECTOMY (Also Adnoids)   Family History:  High blood pressure (Hypertension) Mother  Heart disease Mother  Breast cancer Mother  Lung cancer Mother  Esophageal cancer Mother  Diabetes Maternal Grandmother   Social History:   Socioeconomic History:  Marital status: Single  Tobacco Use  Smoking status: Every Day  Packs/day: 20.00  Types: Cigarettes  Smokeless tobacco: Never  Tobacco comments:  1 PACK OF 20 EVERY 2 DAYS  Substance and Sexual Activity  Alcohol use: Not Currently  Drug use: Never  Sexual activity: Yes  Birth control/protection: None   Review of Systems:  A comprehensive 14 point ROS was performed, reviewed, and the pertinent orthopaedic findings are documented in the HPI.  Physical Exam: Vitals:  04/10/22 0935  BP: (!) 146/99  Pulse: 80  Weight: (!) 100.8 kg (222 lb 4.8 oz)  Height: 165.1 cm (5\' 5" )  PainSc: 0-No pain  PainLoc: Hip   General/Constitutional: Pleasant significantly overweight middle-aged female in no acute distress. Neuro/Psych: Normal mood and affect, oriented to person, place and time. Eyes: Non-icteric. Pupils are equal, round, and reactive to light, and exhibit synchronous movement. ENT: Unremarkable. Lymphatic: No palpable adenopathy. Respiratory: Lungs clear to auscultation, Normal chest excursion, No wheezes, and Non-labored breathing Cardiovascular: Regular rate and rhythm. No murmurs. and No edema, swelling or tenderness, except as noted in detailed exam. Integumentary: No impressive skin lesions present, except as noted in detailed exam. Musculoskeletal: Unremarkable, except as noted in detailed exam.  Lumbar exam: Skin inspection of the lower back is unremarkable. In stance, her spine is straight and her pelvis is level. She can arise  from a seated position without difficulty, and demonstrates a normal gait. She has no tenderness along the thoracic or lumbar spine, nor across the sacrum. She has mild  pain over the right lateral hip region, but there is no tenderness in either sciatic notch or along either SI joint. She can heel raise and toe raise without difficulty or weakness. She is able to march in place without evidence of hip abductor weakness.  Hip exam: She has moderate pain with right hip range of motion, and mild pain with left hip range of motion. She exhibits flexion to 100 degrees, internal rotation to 10 degrees, and external rotation to 25 degrees on the right and flexion to 105 degrees, internal rotation to 15 degrees, and external rotation to 40 degrees on the left. She is neurovascularly intact to both lower extremities. She has negative sitting straight leg raises bilaterally.  Heart: Examination of the heart reveals regular, rate, and rhythm. There is no murmur noted on ascultation. There is a normal apical pulse.  Lungs: Lungs are clear to auscultation. There is no wheeze, rhonchi, or crackles. There is normal expansion of bilateral chest walls.   X-rays/MRI/Lab data:  Recent x-rays of the pelvis and right hip are available for review. These films demonstrate moderate degenerative changes of the right greater than left hip joints as manifest by mild joint space narrowing, especially medially, and osteophyte formation around both femoral necks. No fractures, lytic lesions, or other acute bony abnormalities are identified.  Assessment: 1. Primary osteoarthritis of right hip.  2. Adult BMI 36.0-36.9.   Plan: The treatment options were discussed with the patient and her significant other. In addition, patient educational materials were provided regarding the diagnosis and treatment options. The patient is quite frustrated by her symptoms and functional limitations, and is ready to consider more aggressive treatment options. Therefore, I have recommended a surgical procedure, specifically a right total hip arthroplasty. The procedure was discussed with the patient, as were the  potential risks (including bleeding, infection, nerve and/or blood vessel injury, persistent or recurrent pain, loosening and/or failure of the components, dislocation, leg length inequality, need for further surgery, blood clots, strokes, heart attacks and/or arhythmias, pneumonia, etc.) and benefits. The patient states his/her understanding and wishes to proceed. All of the patient's questions and concerns were answered. She can call any time with further concerns. She will return to work without restrictions. She will follow up post-surgery, routine.   H&P reviewed and patient re-examined. No changes.

## 2022-04-18 NOTE — Anesthesia Procedure Notes (Signed)
Spinal  Patient location during procedure: OR Start time: 04/18/2022 7:48 AM End time: 04/18/2022 7:58 AM Reason for block: surgical anesthesia Staffing Resident/CRNA: Cammie Sickle, CRNA Performed by: Cammie Sickle, CRNA Authorized by: Ilene Qua, MD   Preanesthetic Checklist Completed: patient identified, IV checked, site marked, risks and benefits discussed, surgical consent, monitors and equipment checked, pre-op evaluation and timeout performed Spinal Block Patient position: sitting Prep: ChloraPrep Patient monitoring: heart rate, continuous pulse ox and blood pressure Approach: midline Location: L3-4 Injection technique: single-shot Needle Needle type: Introducer and Pencan  Needle gauge: 24 G Needle length: 10 cm Assessment Sensory level: T6 Events: CSF return Additional Notes Negative paresthesia. Negative blood return. Positive free-flowing CSF. Expiration date of kit checked and confirmed. Patient tolerated procedure well, without complications. Successful on second attempt. Repositioned 1 level above initial attempt. Pt. Tolerated procedure well. No complications noted.

## 2022-04-18 NOTE — Evaluation (Signed)
Physical Therapy Evaluation Patient Details Name: Donna Odonnell MRN: [id number] DOB: 05/13/1976 Today's Date: 04/18/2022  History of Present Illness  Patient s/p R THA.  Clinical Impression  Patient received in bed, sign. Other at bedside. She is agreeable to PT assessment. Posterior hip precautions reviewed with patient and handout given. Patient required min A for bed mobility and transfers with cues for safety. Min guard for ambulation with RW and cues for hip precautions during mobility. Patient doing well, gait limited by feeling a little nauseated. She will continue to benefit from skilled PT while here to improve functional independence and safety with mobility.          Recommendations for follow up therapy are one component of a multi-disciplinary discharge planning process, led by the attending physician.  Recommendations may be updated based on patient status, additional functional criteria and insurance authorization.  Follow Up Recommendations Home health PT      Assistance Recommended at Discharge Intermittent Supervision/Assistance  Patient can return home with the following  A little help with walking and/or transfers;A little help with bathing/dressing/bathroom;Help with stairs or ramp for entrance;Assist for transportation;Assistance with cooking/housework    Equipment Recommendations Rolling walker (2 wheels);BSC/3in1  Recommendations for Other Services       Functional Status Assessment Patient has had a recent decline in their functional status and demonstrates the ability to make significant improvements in function in a reasonable and predictable amount of time.     Precautions / Restrictions Precautions Precautions: Posterior Hip;Fall Precaution Booklet Issued: Yes (comment) Restrictions Weight Bearing Restrictions: Yes RLE Weight Bearing: Weight bearing as tolerated      Mobility  Bed Mobility Overal bed mobility: Needs Assistance Bed Mobility: Supine  to Sit, Sit to Supine     Supine to sit: Min assist Sit to supine: Min assist        Transfers Overall transfer level: Needs assistance Equipment used: Rolling walker (2 wheels) Transfers: Sit to/from Stand Sit to Stand: Min guard                Ambulation/Gait Ambulation/Gait assistance: Min guard Gait Distance (Feet): 20 Feet Assistive device: Rolling walker (2 wheels) Gait Pattern/deviations: Step-to pattern, Decreased step length - right, Decreased step length - left, Decreased stride length Gait velocity: decreased     General Gait Details: slow, steady pace. Good weight shirt ability. Cues for sequencing and precautions  Stairs            Wheelchair Mobility    Modified Rankin (Stroke Patients Only)       Balance Overall balance assessment: Needs assistance Sitting-balance support: Feet supported Sitting balance-Leahy Scale: Good     Standing balance support: Bilateral upper extremity supported, During functional activity, Reliant on assistive device for balance Standing balance-Leahy Scale: Good                               Pertinent Vitals/Pain Pain Assessment Pain Assessment: 0-10 Pain Score: 7  Pain Location: right hip Pain Descriptors / Indicators: Discomfort, Sore Pain Intervention(s): Monitored during session, Repositioned, Ice applied    Home Living Family/patient expects to be discharged to:: Private residence Living Arrangements: Spouse/significant other Available Help at Discharge: Family;Available 24 hours/day Type of Home: House Home Access: Stairs to enter Entrance Stairs-Rails: Right;Left;Can reach both Entrance Stairs-Number of Steps: 5   Home Layout: One level Home Equipment: None      Prior Function Prior Level of  Function : Independent/Modified Independent;Driving             Mobility Comments: independent ADLs Comments: independent     Hand Dominance        Extremity/Trunk Assessment    Upper Extremity Assessment Upper Extremity Assessment: Overall WFL for tasks assessed    Lower Extremity Assessment Lower Extremity Assessment: RLE deficits/detail RLE Deficits / Details: general pain, weakness, decreased rom s/p surgery    Cervical / Trunk Assessment Cervical / Trunk Assessment: Normal  Communication   Communication: No difficulties  Cognition Arousal/Alertness: Lethargic Behavior During Therapy: Flat affect Overall Cognitive Status: Within Functional Limits for tasks assessed                                          General Comments      Exercises Total Joint Exercises Ankle Circles/Pumps: AROM, Both, 10 reps Quad Sets: AROM, Right, 10 reps Gluteal Sets: AROM, Both, 10 reps   Assessment/Plan    PT Assessment Patient needs continued PT services  PT Problem List Decreased strength;Decreased mobility;Pain;Decreased activity tolerance;Decreased knowledge of precautions       PT Treatment Interventions DME instruction;Therapeutic exercise;Gait training;Stair training;Functional mobility training;Therapeutic activities;Patient/family education;Modalities;Neuromuscular re-education    PT Goals (Current goals can be found in the Care Plan section)  Acute Rehab PT Goals Patient Stated Goal: return home tomorrow PT Goal Formulation: With patient/family Time For Goal Achievement: 04/25/22 Potential to Achieve Goals: Good    Frequency BID     Co-evaluation               AM-PAC PT "6 Clicks" Mobility  Outcome Measure Help needed turning from your back to your side while in a flat bed without using bedrails?: A Little Help needed moving from lying on your back to sitting on the side of a flat bed without using bedrails?: A Little Help needed moving to and from a bed to a chair (including a wheelchair)?: A Little Help needed standing up from a chair using your arms (e.g., wheelchair or bedside chair)?: A Little Help needed to walk  in hospital room?: A Little Help needed climbing 3-5 steps with a railing? : A Little 6 Click Score: 18    End of Session Equipment Utilized During Treatment: Gait belt Activity Tolerance: Patient tolerated treatment well Patient left: in bed;with call bell/phone within reach;with family/visitor present;with SCD's reapplied Nurse Communication: Mobility status;Other (comment) (wound vac is making unusual noise) PT Visit Diagnosis: Other abnormalities of gait and mobility (R26.89);Difficulty in walking, not elsewhere classified (R26.2);Pain Pain - Right/Left: Right Pain - part of body: Hip;Leg    Time: 1255-1330 PT Time Calculation (min) (ACUTE ONLY): 35 min   Charges:   PT Evaluation $PT Eval Moderate Complexity: 1 Mod PT Treatments $Gait Training: 8-22 mins        Aariona Momon, PT, GCS 04/18/22,1:50 PM

## 2022-04-18 NOTE — Discharge Instructions (Signed)
Orthopedic discharge instructions: May shower with intact OpSite dressing. Apply ice frequently to hip. Start Eliquis 1 tablet (2.5 mg) twice daily on Friday, 04/19/2022, for 2 weeks, then take aspirin 325 mg twice daily for 4 weeks. Take pain medication as prescribed when needed.  May supplement with ES Tylenol if necessary. May weight-bear as tolerated on right leg - use walker for balance and support. Maintain posterior hip precautions. Follow-up in 10-14 days or as scheduled.

## 2022-04-18 NOTE — Progress Notes (Signed)
Informed via text, Mickie Kay (friend), patient going to room 160.

## 2022-04-18 NOTE — Op Note (Signed)
04/18/2022  10:30 AM  Patient:   Donna Odonnell  Pre-Op Diagnosis:   Degenerative joint disease, right hip.  Post-Op Diagnosis:   Same.  Procedure:   Right total hip arthroplasty.  Surgeon:   Pascal Lux, MD  Assistant:   Cameron Proud, PA-C  Anesthesia:   Spinal  Findings:   As above.  Complications:   None  EBL:   150 cc  Fluids:   500 cc crystalloid  UOP:   None  TT:   None  Drains:   None  Closure:   Staples  Implants:   Biomet press-fit system with a #8 laterally offset Echo femoral stem, a 48 mm acetabular shell with an E-poly hi-wall liner, and a 32 mm ceramic head with a -3 mm neck.  Brief Clinical Note:   The patient is a 46 year old female with a history of progressive worsening right hip pain. Her symptoms have progressed despite medications, activity modification, etc. Her history and examination consistent with advanced degenerative joint disease, confirmed by plain radiographs. The patient presents at this time for a right total hip arthroplasty.   Procedure:   The patient was brought into the operating room. After adequate spinal anesthesia was obtained, the patient was repositioned in the left lateral decubitus position and secured using a lateral hip positioner. The right hip and lower extremity were prepped with ChloroPrep solution before being draped sterilely. Preoperative antibiotics were administered. A timeout was performed to verify the appropriate surgical site.    A standard posterior approach to the hip was made through an approximately 8-9 inch incision. The incision was carried down through the subcutaneous tissues to expose the gluteal fascia and proximal end of the iliotibial band. These structures were split the length of the incision and the Charnley self-retaining hip retractor placed. The bursal tissues were swept posteriorly to expose the short external rotators. The anterior border of the piriformis tendon was identified and this plane  developed down through the capsule to enter the joint. A flap of tissue was elevated off the posterior aspect of the femoral neck and greater trochanter and retracted posteriorly. This flap included the piriformis tendon, the short external rotators, and the posterior capsule. The soft tissues were elevated off the lateral aspect of the ilium and a large Steinmann pin placed bicortically.   With the right leg aligned over the left, a drill bit was placed into the greater trochanter parallel to the Steinmann pin and the distance between these two pins measured in order to optimize leg lengths postoperatively. The drill bit was removed and the hip dislocated. The piriformis fossa was debrided of soft tissues before the intramedullary canal was accessed through this point using a triple step reamer. The canal was reamed sequentially beginning with a #7 tapered reamer and progressing to a #8 tapered reamer. This provided excellent circumferential chatter. Using the appropriate guide, a femoral neck cut was made 10-12 mm above the lesser trochanter. The femoral head was removed.  Attention was directed to the acetabular side. The labrum was debrided circumferentially before the ligamentum teres was removed using a large curette. A line was drawn on the drapes corresponding to the native version of the acetabulum. This line was used as a guide while the acetabulum was reamed sequentially beginning with a 39 mm reamer and progressing to a 47 mm reamer. This provided excellent circumferential chatter. The 47 mm trial acetabulum was positioned and found to fit quite well. Therefore, the 48 mm acetabular shell  was selected and impacted into place with care taken to maintain the appropriate version. The trial high wall liner was inserted.  Attention was redirected to the femoral side. A box osteotome was used to establish version before the canal was broached sequentially beginning with a #7 broach and progressing to a  #8 broach. This was left in place and several trial reductions performed using both the standard and laterally offset neck options, as well as the -6 mm and -3 mm neck lengths. After removing the trial components, the "manhole cover" was placed into the apex of the acetabular shell and tightened securely. The permanent E-polyethylene hi-wall liner was impacted into the acetabular shell and its locking mechanism verified using a quarter-inch osteotome. Next, the #8 laterally offset femoral stem was impacted into place with care taken to maintain the appropriate version. A repeat trial reduction was performed using the -6 mm and -3 mm neck lengths. The -3 mm neck length demonstrated excellent stability both in extension and external rotation as well as with flexion to 90 and internal rotation beyond 70. It also was stable in the position of sleep. In addition, leg lengths appeared to be restored appropriately, both by reassessing the position of the right leg over the left, as well as by measuring the distance between the Steinmann pin and the drill bit. The 32 mm ceramic head with the -3 mm neck was impacted onto the stem of the femoral component. The Morse taper locking mechanism was verified using manual distraction before the head was relocated and placed through a range of motion with the findings as described above.  The wound was copiously irrigated with sterile saline solution via the jet lavage system before the peri-incisional and pericapsular tissues were injected with a solution comprised of 30 cc of 0.5% Sensorcaine with epinephrine, 20 cc of Exparel, 2 cc of Kenalog 40 (80 mg), and 30 mg of Toradol diluted out to 90 cc with normal saline to help with postoperative analgesia. The posterior flap was reapproximated to the posterior aspect of the greater trochanter using #2 Tycron interrupted sutures placed through drill holes. Several additional #2 Tycron interrupted sutures were used to reinforce this  layer of closure. The iliotibial band was reapproximated using #1 Vicryl interrupted sutures before the gluteal fascia was closed using a running #1 Vicryl suture. At this point, 1 g of transexemic acid in 10 cc of normal saline was injected into the joint to help reduce postoperative bleeding. The subcutaneous tissues were closed in several layers using 2-0 Vicryl interrupted sutures before the skin was closed using staples. A sterile occlusive dressing was applied to the wound. The patient was then rolled back into the supine position on her hospital bed before being awakened and returned to the recovery room in satisfactory condition after tolerating the procedure well.

## 2022-04-18 NOTE — Plan of Care (Signed)

## 2022-04-18 NOTE — Transfer of Care (Signed)
Immediate Anesthesia Transfer of Care Note  Patient: Donna Odonnell  Procedure(s) Performed: TOTAL HIP ARTHROPLASTY (Right: Hip)  Patient Location: PACU  Anesthesia Type:Spinal  Level of Consciousness: drowsy  Airway & Oxygen Therapy: Patient Spontanous Breathing and Patient connected to face mask oxygen  Post-op Assessment: Report given to RN and Post -op Vital signs reviewed and stable  Post vital signs: Reviewed and stable  Last Vitals:  Vitals Value Taken Time  BP 150/83 04/18/22 1034  Temp 35.9 1034  Pulse 73 04/18/22 1038  Resp 17 04/18/22 1038  SpO2 100 % 04/18/22 1038  Vitals shown include unvalidated device data.  Last Pain:  Vitals:   04/18/22 0630  TempSrc: Temporal  PainSc: 8          Complications: No notable events documented.

## 2022-04-18 NOTE — Anesthesia Postprocedure Evaluation (Signed)
Anesthesia Post Note  Patient: Donna Odonnell  Procedure(s) Performed: TOTAL HIP ARTHROPLASTY (Right: Hip)  Patient location during evaluation: PACU Anesthesia Type: Combined General/Spinal Level of consciousness: oriented and awake and alert Pain management: pain level controlled Vital Signs Assessment: post-procedure vital signs reviewed and stable Respiratory status: spontaneous breathing, respiratory function stable and patient connected to nasal cannula oxygen Cardiovascular status: blood pressure returned to baseline and stable Postop Assessment: no headache, no backache and no apparent nausea or vomiting Anesthetic complications: no   No notable events documented.   Last Vitals:  Vitals:   04/18/22 0630 04/18/22 1034  BP: 131/89 (!) 150/83  Pulse: 66 67  Resp: 16 20  Temp: 36.6 C (!) 36 C  SpO2: 98% 96%    Last Pain:  Vitals:   04/18/22 1034  TempSrc:   PainSc: 0-No pain                 Ilene Qua

## 2022-04-19 DIAGNOSIS — Z79899 Other long term (current) drug therapy: Secondary | ICD-10-CM | POA: Diagnosis not present

## 2022-04-19 DIAGNOSIS — Z96641 Presence of right artificial hip joint: Secondary | ICD-10-CM | POA: Diagnosis not present

## 2022-04-19 DIAGNOSIS — I10 Essential (primary) hypertension: Secondary | ICD-10-CM | POA: Diagnosis not present

## 2022-04-19 DIAGNOSIS — M1611 Unilateral primary osteoarthritis, right hip: Secondary | ICD-10-CM | POA: Diagnosis not present

## 2022-04-19 DIAGNOSIS — Z6836 Body mass index (BMI) 36.0-36.9, adult: Secondary | ICD-10-CM | POA: Diagnosis not present

## 2022-04-19 DIAGNOSIS — Z91040 Latex allergy status: Secondary | ICD-10-CM | POA: Diagnosis not present

## 2022-04-19 DIAGNOSIS — F17210 Nicotine dependence, cigarettes, uncomplicated: Secondary | ICD-10-CM | POA: Diagnosis not present

## 2022-04-19 DIAGNOSIS — Z8616 Personal history of COVID-19: Secondary | ICD-10-CM | POA: Diagnosis not present

## 2022-04-19 LAB — BASIC METABOLIC PANEL
Anion gap: 4 — ABNORMAL LOW (ref 5–15)
BUN: 12 mg/dL (ref 6–20)
CO2: 24 mmol/L (ref 22–32)
Calcium: 8.7 mg/dL — ABNORMAL LOW (ref 8.9–10.3)
Chloride: 110 mmol/L (ref 98–111)
Creatinine, Ser: 0.73 mg/dL (ref 0.44–1.00)
GFR, Estimated: >60 mL/min (ref >60)
Glucose, Bld: 176 mg/dL — ABNORMAL HIGH (ref 70–99)
Potassium: 4.1 mmol/L (ref 3.5–5.1)
Sodium: 138 mmol/L (ref 135–145)

## 2022-04-19 LAB — CBC
HCT: 32.6 % — ABNORMAL LOW (ref 36.0–46.0)
Hemoglobin: 10.4 g/dL — ABNORMAL LOW (ref 12.0–15.0)
MCH: 27.6 pg (ref 26.0–34.0)
MCHC: 31.9 g/dL (ref 30.0–36.0)
MCV: 86.5 fL (ref 80.0–100.0)
Platelets: 246 10*3/uL (ref 150–400)
RBC: 3.77 MIL/uL — ABNORMAL LOW (ref 3.87–5.11)
RDW: 14.8 % (ref 11.5–15.5)
WBC: 17.5 10*3/uL — ABNORMAL HIGH (ref 4.0–10.5)
nRBC: 0 % (ref 0.0–0.2)

## 2022-04-19 NOTE — Discharge Summary (Signed)
Physician Discharge Summary  Patient ID: Donna Odonnell MRN: [id number] DOB/AGE: August 21, 1975 46 y.o.  Admit date: 04/18/2022 Discharge date: 04/19/2022  Admission Diagnoses:  Status post total hip replacement, right [Z96.641]  Surgeries:Procedure(s):  Right total hip arthroplasty.   Surgeon:   Pascal Lux, MD   Assistant:   Cameron Proud, PA-C   Anesthesia:   Spinal   Findings:   As above.   Complications:   None   EBL:   150 cc   Fluids:   500 cc crystalloid   UOP:   None   TT:   None   Drains:   None   Closure:   Staples   Implants:   Biomet press-fit system with a #8 laterally offset Echo femoral stem, a 48 mm acetabular shell with an E-poly hi-wall liner, and a 32 mm ceramic head with a -3 mm neck.  Discharge Diagnoses: Patient Active Problem List   Diagnosis Date Noted   Status post total hip replacement, right 04/18/2022   Primary hypertension 09/06/2021   COVID 07/13/2021   Annual physical exam 02/21/2021   Left shoulder pain 02/19/2021   Screening for cervical cancer 02/19/2021    Past Medical History:  Diagnosis Date   COVID-19    GERD (gastroesophageal reflux disease)    Hypertension    Osteoarthritis    Tobacco use      Transfusion:    Consultants (if any):   Discharged Condition: Improved  Hospital Course: Donna Odonnell is an 46 y.o. female who was admitted 04/18/2022 with a diagnosis of right hip osteoarthritis and went to the operating room on 04/18/2022 and underwent right total hip arthroplasty through posterior approach. The patient received perioperative antibiotics for prophylaxis (see below). The patient tolerated the procedure well and was transported to PACU in stable condition. After meeting PACU criteria, the patient was subsequently transferred to the Orthopaedics/Rehabilitation unit.   The patient received DVT prophylaxis in the form of early mobilization, TED hose and Eliquis, SCDs . A sacral pad had been placed and heels  were elevated off of the bed with rolled towels in order to protect skin integrity. Foley catheter was discontinued on postoperative day #0. The surgical incision was healing well without signs of infection.  Physical therapy was initiated postoperatively for transfers, gait training, and strengthening. Occupational therapy was initiated for activities of daily living and evaluation for assisted devices. Rehabilitation goals were reviewed in detail with the patient. The patient made steady progress with physical therapy and physical therapy recommended discharge to Home.   The patient achieved the preliminary goals of this hospitalization and was felt to be medically and orthopaedically appropriate for discharge.  She was given perioperative antibiotics:  Anti-infectives (From admission, onward)    Start     Dose/Rate Route Frequency Ordered Stop   04/18/22 1400  ceFAZolin (ANCEF) IVPB 2g/100 mL premix        2 g 200 mL/hr over 30 Minutes Intravenous Every 6 hours 04/18/22 1025 04/19/22 0236   04/18/22 0629  ceFAZolin (ANCEF) 2-4 GM/100ML-% IVPB       Note to Pharmacy: Sylvester Harder P: cabinet override      04/18/22 0629 04/18/22 0819   04/18/22 0600  ceFAZolin (ANCEF) IVPB 2g/100 mL premix        2 g 200 mL/hr over 30 Minutes Intravenous On call to O.R. 04/18/22 2952 04/18/22 0825     .  Recent vital signs:  Vitals:   04/19/22 0444 04/19/22 8413  BP: 120/61 124/77  Pulse: 72 70  Resp: 20 16  Temp: 98.8 F (37.1 C) 98.1 F (36.7 C)  SpO2: 97% 98%    Recent laboratory studies:  Recent Labs    04/19/22 0540  WBC 17.5*  HGB 10.4*  HCT 32.6*  PLT 246  K 4.1  CL 110  CO2 24  BUN 12  CREATININE 0.73  GLUCOSE 176*  CALCIUM 8.7*    Diagnostic Studies: DG HIP UNILAT W OR W/O PELVIS 2-3 VIEWS RIGHT  Result Date: 04/18/2022 CLINICAL DATA:  Total right hip arthroplasty. EXAM: DG HIP (WITH OR WITHOUT PELVIS) 2-3V RIGHT COMPARISON:  02/23/2022 FINDINGS: Well seated components  of a total right hip arthroplasty. No complicating features are identified. IMPRESSION: Well seated components of a total right hip arthroplasty. Electronically Signed   By: Marijo Sanes M.D.   On: 04/18/2022 11:59    Discharge Medications:   Allergies as of 04/19/2022       Reactions   Ciprofloxacin Hives   Other Rash   Latex gloves break out tops of hands only not palms-unsure if there is a latex allergy        Medication List     STOP taking these medications    meloxicam 15 MG tablet Commonly known as: MOBIC       TAKE these medications    apixaban 2.5 MG Tabs tablet Commonly known as: Eliquis Take 1 tablet (2.5 mg total) by mouth 2 (two) times daily.   hydrochlorothiazide 12.5 MG tablet Commonly known as: HYDRODIURIL Take 1 tablet (12.5 mg total) by mouth daily. for high blood pressure   omeprazole 20 MG capsule Commonly known as: PRILOSEC Take 20 mg by mouth as needed.   oxyCODONE 5 MG immediate release tablet Commonly known as: Roxicodone Take 1-2 tablets (5-10 mg total) by mouth every 4 (four) hours as needed for moderate pain or severe pain.               Durable Medical Equipment  (From admission, onward)           Start     Ordered   04/18/22 1247  DME Walker rolling  Once       Question Answer Comment  Walker: With [place] Wheels   Patient needs a walker to treat with the following condition Status post total hip replacement, right      04/18/22 1246   04/18/22 1247  DME 3 n 1  Once        04/18/22 1246   04/18/22 1247  DME Bedside commode  Once       Question:  Patient needs a bedside commode to treat with the following condition  Answer:  Status post total hip replacement, right   04/18/22 1246            Disposition: Home with home health PT     Follow-up Information     Reche Dixon, PA-C. Go to.   Specialty: Orthopedic Surgery Why: 05/06/22 @ 9:15 AM Contact information: [address] [place] [address] [phone]         Poggi, Marshall Cork, MD. Go to.   Specialty: Orthopedic Surgery Why: 06/05/22 @ 9:45 AM Contact information: [place] [id number] [place], PA-C 04/19/2022, 12:37 PM

## 2022-04-19 NOTE — Progress Notes (Signed)
Physical Therapy Treatment Patient Details Name: Donna Odonnell MRN: [id number] DOB: [birth year] Today's Date: 04/19/2022   History of Present Illness Patient s/p R THA.    PT Comments    Pt was long sititng in bed upon arrival. She is A and O x 4 and agreeable to session. Endorses 3/10 pain but overall states" Its not bad." MD/PA arrived during session. She was safely able to exit bed, stand, and ambulate with RW without safety concern. Easily and safely able to ascend/descend 4 stair with step to pattern.Pt is cleared from an acute PT standpoint for safe DC home with HHPT to follow. Will continue to benefit form PT to address deficits while maximizing independence with ADLs.    Recommendations for follow up therapy are one component of a multi-disciplinary discharge planning process, led by the attending physician.  Recommendations may be updated based on patient status, additional functional criteria and insurance authorization.  Follow Up Recommendations  Home health PT     Assistance Recommended at Discharge Set up Supervision/Assistance  Patient can return home with the following A little help with walking and/or transfers;A little help with bathing/dressing/bathroom;Help with stairs or ramp for entrance;Assist for transportation;Assistance with cooking/housework   Equipment Recommendations  Rolling walker (2 wheels);BSC/3in1       Precautions / Restrictions Precautions Precautions: Posterior Hip;Fall Precaution Booklet Issued: Yes (comment) Restrictions Weight Bearing Restrictions: Yes RLE Weight Bearing: Weight bearing as tolerated     Mobility  Bed Mobility Overal bed mobility: Needs Assistance Bed Mobility: Supine to Sit  Supine to sit: Supervision   Transfers Overall transfer level: Needs assistance Equipment used: Rolling walker (2 wheels) Transfers: Sit to/from Stand Sit to Stand: Supervision     Ambulation/Gait Ambulation/Gait assistance: Supervision Gait  Distance (Feet): 200 Feet Assistive device: Rolling walker (2 wheels) Gait Pattern/deviations: Step-through pattern Gait velocity: decreased     General Gait Details: Pt demonstrated safe steady gait kinematics without LOB or safety concern.   Stairs Stairs: Yes Stairs assistance: Supervision Stair Management: Sideways, One rail Right, Step to pattern Number of Stairs: 4 General stair comments: Pt dmeonstrated safe steady gait kinematics without LOB or safety concern     Balance Overall balance assessment: Modified Independent         Cognition Arousal/Alertness: Awake/alert Behavior During Therapy: WFL for tasks assessed/performed Overall Cognitive Status: Within Functional Limits for tasks assessed      General Comments: pt is A and O x 4 and extremely pleasant           General Comments General comments (skin integrity, edema, etc.): Author reviewed HEP and pt states and demonstrates understanding. Reviewed posterior hip precautions and proper car transfers. MD/PA in room during session and planning to DC this afternoon      Pertinent Vitals/Pain Pain Assessment Pain Assessment: 0-10 Pain Score: 3  Pain Location: right hip Pain Descriptors / Indicators: Discomfort Pain Intervention(s): Limited activity within patient's tolerance, Monitored during session, Premedicated before session, Repositioned, Ice applied     PT Goals (current goals can now be found in the care plan section) Acute Rehab PT Goals Patient Stated Goal: return home today Progress towards PT goals: Progressing toward goals    Frequency    BID      PT Plan Current plan remains appropriate       AM-PAC PT "6 Clicks" Mobility   Outcome Measure  Help needed turning from your back to your side while in a flat bed without using bedrails?: A Little  Help needed moving from lying on your back to sitting on the side of a flat bed without using bedrails?: A Little Help needed moving to and  from a bed to a chair (including a wheelchair)?: A Little Help needed standing up from a chair using your arms (e.g., wheelchair or bedside chair)?: A Little Help needed to walk in hospital room?: A Little Help needed climbing 3-5 steps with a railing? : A Little 6 Click Score: 18    End of Session   Activity Tolerance: Patient tolerated treatment well Patient left: in chair;with call bell/phone within reach;with chair alarm set Nurse Communication: Mobility status PT Visit Diagnosis: Other abnormalities of gait and mobility (R26.89);Difficulty in walking, not elsewhere classified (R26.2);Pain Pain - Right/Left: Right Pain - part of body: Hip;Leg     Time: 2353-6144 PT Time Calculation (min) (ACUTE ONLY): 28 min  Charges:  $Gait Training: 8-22 mins $Therapeutic Exercise: 8-22 mins                     Julaine Fusi PTA 04/19/22, 1:14 PM

## 2022-04-19 NOTE — Progress Notes (Signed)
Prevena dressing removed, no drainage noted in cannister. Honeycomb dressing applied.

## 2022-04-19 NOTE — Progress Notes (Signed)
  Subjective: 1 Day Post-Op Procedure(s) (LRB): TOTAL HIP ARTHROPLASTY (Right) Dr Roland Rack and PT at bedside. Patient reports pain as well-controlled.   Patient is well, and has had no acute complaints or problems Plan is to go Home after hospital stay. Negative for chest pain and shortness of breath Fever: no   Objective: Vital signs in last 24 hours: Temp:  [96.8 F (36 C)-99 F (37.2 C)] 98.1 F (36.7 C) (10/06 0748) Pulse Rate:  [63-84] 70 (10/06 0748) Resp:  [11-20] 16 (10/06 0748) BP: (106-150)/(61-92) 124/77 (10/06 0748) SpO2:  [93 %-100 %] 98 % (10/06 0748)  Intake/Output from previous day:  Intake/Output Summary (Last 24 hours) at 04/19/2022 0816 Last data filed at 04/18/2022 2052 Gross per 24 hour  Intake 1096.88 ml  Output 850 ml  Net 246.88 ml    Intake/Output this shift: No intake/output data recorded.  Labs: Recent Labs    04/19/22 0540  HGB 10.4*   Recent Labs    04/19/22 0540  WBC 17.5*  RBC 3.77*  HCT 32.6*  PLT 246   Recent Labs    04/19/22 0540  NA 138  K 4.1  CL 110  CO2 24  BUN 12  CREATININE 0.73  GLUCOSE 176*  CALCIUM 8.7*   No results for input(s): "LABPT", "INR" in the last 72 hours.   EXAM General - Patient is Alert, Appropriate, and Oriented Extremity - Neurovascular intact Dorsiflexion/Plantar flexion intact Compartment soft Dressing/Incision -Prevena in place, no drainage in cannister  Motor Function - intact, moving foot and toes well on exam.      Assessment/Plan: 1 Day Post-Op Procedure(s) (LRB): TOTAL HIP ARTHROPLASTY (Right) Principal Problem:   Status post total hip replacement, right  Estimated body mass index is 37.96 kg/m as calculated from the following:   Height as of this encounter: 5\' 4"  (1.626 m).   Weight as of this encounter: 100.3 kg. Advance diet Up with therapy  Plan for d/c later to day pending completion of therapy goals.      DVT Prophylaxis - Eliquis, Ted hose, and  SCDs Weight-Bearing as tolerated to right leg  Cassell Smiles, PA-C [facility] Orthopaedic Surgery 04/19/2022, 8:16 AM

## 2022-04-19 NOTE — Progress Notes (Cosign Needed)
Patient is not able to walk the distance required to go the bathroom, or he/she is unable to safely negotiate stairs required to access the bathroom.  A 3in1 BSC will alleviate this problem  

## 2022-04-19 NOTE — Progress Notes (Signed)
Met with the patient and her SO in the room She needs a Rolling walker and a 3 in 1 [employer] to deliver to the room prior to DC Set up with [place] prior to surgery by surgeons office SO provides transportation

## 2022-04-19 NOTE — Plan of Care (Signed)

## 2022-04-20 DIAGNOSIS — Z471 Aftercare following joint replacement surgery: Secondary | ICD-10-CM | POA: Diagnosis not present

## 2022-04-22 LAB — SURGICAL PATHOLOGY

## 2022-04-23 DIAGNOSIS — Z471 Aftercare following joint replacement surgery: Secondary | ICD-10-CM | POA: Diagnosis not present

## 2022-04-24 DIAGNOSIS — Z471 Aftercare following joint replacement surgery: Secondary | ICD-10-CM | POA: Diagnosis not present

## 2022-04-25 DIAGNOSIS — Z471 Aftercare following joint replacement surgery: Secondary | ICD-10-CM | POA: Diagnosis not present

## 2022-04-29 DIAGNOSIS — F17210 Nicotine dependence, cigarettes, uncomplicated: Secondary | ICD-10-CM | POA: Diagnosis not present

## 2022-04-29 DIAGNOSIS — I10 Essential (primary) hypertension: Secondary | ICD-10-CM | POA: Diagnosis not present

## 2022-04-29 DIAGNOSIS — Z8616 Personal history of COVID-19: Secondary | ICD-10-CM | POA: Diagnosis not present

## 2022-04-29 DIAGNOSIS — K219 Gastro-esophageal reflux disease without esophagitis: Secondary | ICD-10-CM | POA: Diagnosis not present

## 2022-04-29 DIAGNOSIS — Z471 Aftercare following joint replacement surgery: Secondary | ICD-10-CM | POA: Diagnosis not present

## 2022-04-29 DIAGNOSIS — Z96641 Presence of right artificial hip joint: Secondary | ICD-10-CM | POA: Diagnosis not present

## 2022-04-29 DIAGNOSIS — Z7901 Long term (current) use of anticoagulants: Secondary | ICD-10-CM | POA: Diagnosis not present

## 2022-05-01 DIAGNOSIS — I10 Essential (primary) hypertension: Secondary | ICD-10-CM | POA: Diagnosis not present

## 2022-05-01 DIAGNOSIS — K219 Gastro-esophageal reflux disease without esophagitis: Secondary | ICD-10-CM | POA: Diagnosis not present

## 2022-05-01 DIAGNOSIS — F17210 Nicotine dependence, cigarettes, uncomplicated: Secondary | ICD-10-CM | POA: Diagnosis not present

## 2022-05-01 DIAGNOSIS — Z8616 Personal history of COVID-19: Secondary | ICD-10-CM | POA: Diagnosis not present

## 2022-05-01 DIAGNOSIS — Z96641 Presence of right artificial hip joint: Secondary | ICD-10-CM | POA: Diagnosis not present

## 2022-05-01 DIAGNOSIS — Z7901 Long term (current) use of anticoagulants: Secondary | ICD-10-CM | POA: Diagnosis not present

## 2022-05-01 DIAGNOSIS — Z471 Aftercare following joint replacement surgery: Secondary | ICD-10-CM | POA: Diagnosis not present

## 2022-05-03 DIAGNOSIS — K219 Gastro-esophageal reflux disease without esophagitis: Secondary | ICD-10-CM | POA: Diagnosis not present

## 2022-05-03 DIAGNOSIS — Z7901 Long term (current) use of anticoagulants: Secondary | ICD-10-CM | POA: Diagnosis not present

## 2022-05-03 DIAGNOSIS — F17210 Nicotine dependence, cigarettes, uncomplicated: Secondary | ICD-10-CM | POA: Diagnosis not present

## 2022-05-03 DIAGNOSIS — Z471 Aftercare following joint replacement surgery: Secondary | ICD-10-CM | POA: Diagnosis not present

## 2022-05-03 DIAGNOSIS — Z8616 Personal history of COVID-19: Secondary | ICD-10-CM | POA: Diagnosis not present

## 2022-05-03 DIAGNOSIS — I10 Essential (primary) hypertension: Secondary | ICD-10-CM | POA: Diagnosis not present

## 2022-05-03 DIAGNOSIS — Z96641 Presence of right artificial hip joint: Secondary | ICD-10-CM | POA: Diagnosis not present

## 2022-05-21 ENCOUNTER — Other Ambulatory Visit: Payer: Self-pay | Admitting: Nurse Practitioner

## 2022-05-21 DIAGNOSIS — Z1231 Encounter for screening mammogram for malignant neoplasm of breast: Secondary | ICD-10-CM

## 2022-06-05 DIAGNOSIS — M1611 Unilateral primary osteoarthritis, right hip: Secondary | ICD-10-CM | POA: Diagnosis not present

## 2022-06-05 DIAGNOSIS — Z96641 Presence of right artificial hip joint: Secondary | ICD-10-CM | POA: Diagnosis not present

## 2022-06-19 DIAGNOSIS — M25651 Stiffness of right hip, not elsewhere classified: Secondary | ICD-10-CM | POA: Diagnosis not present

## 2022-06-19 DIAGNOSIS — M25551 Pain in right hip: Secondary | ICD-10-CM | POA: Diagnosis not present

## 2022-06-19 DIAGNOSIS — M6281 Muscle weakness (generalized): Secondary | ICD-10-CM | POA: Diagnosis not present

## 2022-06-19 DIAGNOSIS — Z96641 Presence of right artificial hip joint: Secondary | ICD-10-CM | POA: Diagnosis not present

## 2022-06-20 ENCOUNTER — Ambulatory Visit
Admission: RE | Admit: 2022-06-20 | Discharge: 2022-06-20 | Disposition: A | Discharge status: Home / Self Care | Payer: BC Managed Care – PPO | Source: Ambulatory Visit | Attending: Nurse Practitioner | Admitting: Nurse Practitioner

## 2022-06-20 DIAGNOSIS — Z1231 Encounter for screening mammogram for malignant neoplasm of breast: Secondary | ICD-10-CM | POA: Diagnosis not present

## 2022-06-21 NOTE — Progress Notes (Signed)
Please let patient know her Mammogram did not show any evidence of a malignancy.  The recommendation is to repeat the Mammogram in 1 year.  

## 2022-07-24 DIAGNOSIS — M545 Low back pain, unspecified: Secondary | ICD-10-CM | POA: Diagnosis not present

## 2022-07-24 DIAGNOSIS — Z96641 Presence of right artificial hip joint: Secondary | ICD-10-CM | POA: Diagnosis not present

## 2022-07-24 DIAGNOSIS — M1612 Unilateral primary osteoarthritis, left hip: Secondary | ICD-10-CM | POA: Diagnosis not present

## 2022-08-27 DIAGNOSIS — J40 Bronchitis, not specified as acute or chronic: Secondary | ICD-10-CM | POA: Diagnosis not present

## 2022-09-05 NOTE — Progress Notes (Deleted)
There were no vitals taken for this visit.   Subjective:    Patient ID: Donna Odonnell, female    DOB: 05-Sep-1975, 47 y.o.   MRN: [id number]  HPI: Donna Odonnell is a 47 y.o. female presenting on 09/06/2022 for comprehensive medical examination. Current medical complaints include:{Blank single:19197::"none","***"}  She currently lives with: Menopausal Symptoms: {Blank single:19197::"yes","no"}  HYPERTENSION / [place] Satisfied with current treatment? {Blank single:19197::"yes","no"} Duration of hypertension: {Blank single:19197::"chronic","months","years"} BP monitoring frequency: {Blank single:19197::"not checking","rarely","daily","weekly","monthly","a few times a day","a few times a week","a few times a month"} BP range:  BP medication side effects: {Blank single:19197::"yes","no"} Past BP meds: {Blank A999333 (bystolic)","carvedilol","chlorthalidone","clonidine","diltiazem","exforge HCT","HCTZ","irbesartan (avapro)","labetalol","lisinopril","lisinopril-HCTZ","losartan (cozaar)","methyldopa","nifedipine","olmesartan (benicar)","olmesartan-HCTZ","quinapril","ramipril","spironalactone","tekturna","valsartan","valsartan-HCTZ","verapamil"} Duration of hyperlipidemia: {Blank single:19197::"chronic","months","years"} Cholesterol medication side effects: {Blank single:19197::"yes","no"} Cholesterol supplements: {Blank multiple:19196::"none","fish oil","niacin","red yeast rice"} Past cholesterol medications: {Blank multiple:19196::"none","atorvastain (lipitor)","lovastatin (mevacor)","pravastatin (pravachol)","rosuvastatin (crestor)","simvastatin (zocor)","vytorin","fenofibrate (tricor)","gemfibrozil","ezetimide (zetia)","niaspan","lovaza"} Medication compliance: {Blank single:19197::"excellent compliance","good compliance","fair compliance","poor compliance"} Aspirin: {Blank  single:19197::"yes","no"} Recent stressors: {Blank single:19197::"yes","no"} Recurrent headaches: {Blank single:19197::"yes","no"} Visual changes: {Blank single:19197::"yes","no"} Palpitations: {Blank single:19197::"yes","no"} Dyspnea: {Blank single:19197::"yes","no"} Chest pain: {Blank single:19197::"yes","no"} Lower extremity edema: {Blank single:19197::"yes","no"} Dizzy/lightheaded: {Blank single:19197::"yes","no"}  Depression Screen done today and results listed below:     03/04/2022   10:11 AM 07/13/2021   11:12 AM 02/19/2021   10:40 AM 02/05/2021    9:00 AM  Depression screen PHQ 2/9  Decreased Interest 0 0 0 0  Down, Depressed, Hopeless 0 0  0  PHQ - 2 Score 0 0 0 0  Altered sleeping 0 0  0  Tired, decreased energy 0 0  0  Change in appetite 0 0  0  Feeling bad or failure about yourself  0 0  0  Trouble concentrating 0 0  0  Moving slowly or fidgety/restless 0 0  0  Suicidal thoughts 0 0  0  PHQ-9 Score 0 0  0  Difficult doing work/chores Not difficult at all Not difficult at all      The patient {has/does not HA:7771970 a history of falls. I {did/did not:19850} complete a risk assessment for falls. A plan of care for falls {was/was not:19852} documented.   Past Medical History:  Past Medical History:  Diagnosis Date   COVID-19    GERD (gastroesophageal reflux disease)    Hypertension    Osteoarthritis    Tobacco use     Surgical History:  Past Surgical History:  Procedure Laterality Date   CESAREAN SECTION     x3   TONSILLECTOMY     TOTAL HIP ARTHROPLASTY Right 04/18/2022   Procedure: TOTAL HIP ARTHROPLASTY;  Surgeon: Corky Mull, MD;  Location: [facility] ORS;  Service: Orthopedics;  Laterality: Right;   TUBAL LIGATION      Medications:  Current Outpatient Medications on File Prior to Visit  Medication Sig   apixaban (ELIQUIS) 2.5 MG TABS tablet Take 1 tablet (2.5 mg total) by mouth 2 (two) times daily.   hydrochlorothiazide (HYDRODIURIL) 12.5 MG tablet  Take 1 tablet (12.5 mg total) by mouth daily. for high blood pressure   omeprazole (PRILOSEC) 20 MG capsule Take 20 mg by mouth as needed.   oxyCODONE (ROXICODONE) 5 MG immediate release tablet Take 1-2 tablets (5-10 mg total) by mouth every 4 (four) hours as needed for moderate pain or severe pain.   No current facility-administered medications on file prior to visit.    Allergies:  Allergies  Allergen Reactions   Ciprofloxacin Hives   Other Rash    Latex gloves break out tops of hands only not palms-unsure if there  is a latex allergy    Social History:  Social History   Socioeconomic History   Marital status: Single    Spouse name: Not on file   Number of children: Not on file   Years of education: Not on file   Highest education level: Not on file  Occupational History   Not on file  Tobacco Use   Smoking status: Every Day    Packs/day: 0.25    Years: 24.00    Total pack years: 6.00    Types: Cigarettes   Smokeless tobacco: Never  Vaping Use   Vaping Use: Never used  Substance and Sexual Activity   Alcohol use: Not Currently   Drug use: No   Sexual activity: Yes    Birth control/protection: None  Other Topics Concern   Not on file  Social History Narrative   Not on file   Social Determinants of Health   Financial Resource Strain: Not on file  Food Insecurity: No Food Insecurity (04/18/2022)   Hunger Vital Sign    Worried About Running Out of Food in the Last Year: Never true    Ran Out of Food in the Last Year: Never true  Transportation Needs: No Transportation Needs (04/18/2022)   PRAPARE - Hydrologist (Medical): No    Lack of Transportation (Non-Medical): No  Physical Activity: Not on file  Stress: Not on file  Social Connections: Not on file  Intimate Partner Violence: Not At Risk (04/18/2022)   Humiliation, Afraid, Rape, and Kick questionnaire    Fear of Current or Ex-Partner: No    Emotionally Abused: No    Physically  Abused: No    Sexually Abused: No   Social History   Tobacco Use  Smoking Status Every Day   Packs/day: 0.25   Years: 24.00   Total pack years: 6.00   Types: Cigarettes  Smokeless Tobacco Never   Social History   Substance and Sexual Activity  Alcohol Use Not Currently    Family History:  Family History  Problem Relation Age of Onset   Cancer Mother    COPD Mother    Breast cancer Mother 66   Breast cancer Maternal Grandmother 46    Past medical history, surgical history, medications, allergies, family history and social history reviewed with patient today and changes made to appropriate areas of the chart.   ROS All other ROS negative except what is listed above and in the HPI.      Objective:    There were no vitals taken for this visit.  Wt Readings from Last 3 Encounters:  04/18/22 221 lb 1.9 oz (100.3 kg)  04/08/22 221 lb 1.9 oz (100.3 kg)  03/04/22 224 lb (101.6 kg)    Physical Exam  Results for orders placed or performed during the hospital encounter of 04/18/22  CBC  Result Value Ref Range   WBC 17.5 (H) 4.0 - 10.5 K/uL   RBC 3.77 (L) 3.87 - 5.11 MIL/uL   Hemoglobin 10.4 (L) 12.0 - 15.0 g/dL   HCT 32.6 (L) 36.0 - 46.0 %   MCV 86.5 80.0 - 100.0 fL   MCH 27.6 26.0 - 34.0 pg   MCHC 31.9 30.0 - 36.0 g/dL   RDW 14.8 11.5 - 15.5 %   Platelets 246 150 - 400 K/uL   nRBC 0.0 0.0 - 0.2 %  Basic metabolic panel  Result Value Ref Range   Sodium 138 135 - 145 mmol/L   Potassium  4.1 3.5 - 5.1 mmol/L   Chloride 110 98 - 111 mmol/L   CO2 24 22 - 32 mmol/L   Glucose, Bld 176 (H) 70 - 99 mg/dL   BUN 12 6 - 20 mg/dL   Creatinine, Ser 0.73 0.44 - 1.00 mg/dL   Calcium 8.7 (L) 8.9 - 10.3 mg/dL   GFR, Estimated >60 >60 mL/min   Anion gap 4 (L) 5 - 15  Pregnancy, urine POC  Result Value Ref Range   Preg Test, Ur NEGATIVE NEGATIVE  ABO/Rh  Result Value Ref Range   ABO/RH(D)      A POS Performed at [facility], [address], [place], [place]  [address]   Surgical pathology  Result Value Ref Range   SURGICAL PATHOLOGY      SURGICAL PATHOLOGY CASE: [id number] PATIENT: [facility] Surgical Pathology Report     Specimen Submitted: A. Femoral head, right  Clinical History: Primary osteoarthritis of right hip.      DIAGNOSIS: A.  FEMORAL HEAD, RIGHT; ARTHROPLASTY: - DEGENERATIVE OSTEOARTHROPATHY.  GROSS DESCRIPTION: A. Labeled: Right femoral head Received: Fresh Collection time: 8:50 AM on 04/18/2022 Placed into formalin time: 11:24 AM on 04/18/2022 Size of specimen:      Head -4.2 x 4.1 x 3.7 cm      Neck -3.4 x 2.7 x 2.7 cm      Additional tissue: None grossly appreciated. Articular surface: The articular surface is tan and smooth with a 1.9 x 1 cm area of roughening. Cut surface: The cut surface is tan-yellow and firm. Other findings: None grossly appreciated.  Block summary: 1 - representative sections of roughening to adjacent cartilage  Tissue decalcification: Cassette 1  RB 04/18/2022   Final Diagnosis performed by Quay Burow, MD.   Electronically signed 1 0/03/2022 1:39:17PM The electronic signature indicates that the named Attending Pathologist has evaluated the specimen Technical component performed at [facility], [address] [place], [place] [address] Lab: [phone] Dir: Rush Farmer, MD, MMM  Professional component performed at [facility], [facility], [place], [place], [place] [address] Lab: [phone] Dir: Kathi Simpers, MD       Assessment & Plan:   Problem List Items Addressed This Visit       Cardiovascular and Mediastinum   Primary hypertension     Other   Annual physical exam - Primary   Other Visit Diagnoses     Elevated LDL cholesterol level       Encounter for hepatitis C screening test for low risk patient       Screening for HIV (human immunodeficiency virus)            Follow up plan: No follow-ups on file.   LABORATORY  TESTING:  - Pap smear: {Blank [id number] done","not applicable","up to date","done elsewhere"}  IMMUNIZATIONS:   - Tdap: Tetanus vaccination status reviewed: {tetanus status:315746}. - Influenza: {Blank single:19197::"Up to date","Administered today","Postponed to flu season","Refused","Given elsewhere"} - Pneumovax: {Blank single:19197::"Up to date","Administered today","Not applicable","Refused","Given elsewhere"} - Prevnar: {Blank single:19197::"Up to date","Administered today","Not applicable","Refused","Given elsewhere"} - COVID: {Blank single:19197::"Up to date","Administered today","Not applicable","Refused","Given elsewhere"} - HPV: {Blank single:19197::"Up to date","Administered today","Not applicable","Refused","Given elsewhere"} - Shingrix vaccine: {Blank single:19197::"Up to date","Administered today","Not applicable","Refused","Given elsewhere"}  SCREENING: -Mammogram: {Blank single:19197::"Up to date","Ordered today","Not applicable","Refused","Done elsewhere"}  - Colonoscopy: {Blank single:19197::"Up to date","Ordered today","Not applicable","Refused","Done elsewhere"}  - Bone Density: {Blank single:19197::"Up to date","Ordered today","Not applicable","Refused","Done elsewhere"}  -Hearing Test: {Blank single:19197::"Up to date","Ordered today","Not applicable","Refused","Done elsewhere"}  -Spirometry: {Blank single:19197::"Up to date","Ordered today","Not applicable","Refused","Done elsewhere"}  PATIENT COUNSELING:   Advised to take 1 mg of folate supplement per day if capable of pregnancy.   Sexuality: Discussed sexually transmitted diseases, partner selection, use of condoms, avoidance of unintended pregnancy  and contraceptive alternatives.   Advised to avoid cigarette smoking.  I discussed with the patient that most people either abstain from alcohol or drink within safe limits (<=14/week and <=4 drinks/occasion for males, <=7/weeks and <= 3 drinks/occasion for  females) and that the risk for alcohol disorders and other health effects rises proportionally with the number of drinks per week and how often a drinker exceeds daily limits.  Discussed cessation/primary prevention of drug use and availability of treatment for abuse.   Diet: Encouraged to adjust caloric intake to maintain  or achieve ideal body weight, to reduce intake of dietary saturated fat and total fat, to limit sodium intake by avoiding high sodium foods and not adding table salt, and to maintain adequate dietary potassium and calcium preferably from fresh fruits, vegetables, and low-fat dairy products.    stressed the importance of regular exercise  Injury prevention: Discussed safety belts, safety helmets, smoke detector, smoking near bedding or upholstery.   Dental health: Discussed importance of regular tooth brushing, flossing, and dental visits.    NEXT PREVENTATIVE PHYSICAL DUE IN 1 YEAR. No follow-ups on file.

## 2022-09-06 ENCOUNTER — Encounter: Payer: Self-pay | Admitting: Nurse Practitioner

## 2022-09-06 ENCOUNTER — Ambulatory Visit: Payer: BC Managed Care – PPO | Admitting: Nurse Practitioner

## 2022-09-06 VITALS — BP 122/83 | HR 83 | Temp 98.0°F | Wt 233.0 lb

## 2022-09-06 DIAGNOSIS — I10 Essential (primary) hypertension: Secondary | ICD-10-CM

## 2022-09-06 DIAGNOSIS — E7800 Pure hypercholesterolemia, unspecified: Secondary | ICD-10-CM | POA: Diagnosis not present

## 2022-09-06 DIAGNOSIS — Z1159 Encounter for screening for other viral diseases: Secondary | ICD-10-CM

## 2022-09-06 DIAGNOSIS — Z114 Encounter for screening for human immunodeficiency virus [HIV]: Secondary | ICD-10-CM

## 2022-09-06 DIAGNOSIS — Z0000 Encounter for general adult medical examination without abnormal findings: Secondary | ICD-10-CM

## 2022-09-06 MED ORDER — HYDROCHLOROTHIAZIDE 12.5 MG PO TABS: 12.5000 mg | ORAL_TABLET | Freq: Every day | ORAL | 1 refills | Status: DC

## 2022-09-06 NOTE — Assessment & Plan Note (Signed)
Chronic.  Controlled.  Continue with current medication regimen of HCTZ 12.'5mg'$  daily.  Refills sent today.  Labs ordered today.  Return to clinic in 6 months for reevaluation.  Call sooner if concerns arise.

## 2022-09-06 NOTE — Progress Notes (Signed)
BP 122/83   Pulse 83   Temp 98 F (36.7 C) (Oral)   Wt 233 lb (105.7 kg)   LMP 08/14/2022 (Exact Date)   SpO2 98%   BMI 39.99 kg/m    Subjective:    Patient ID: Donna Odonnell, female    DOB: 1975/10/22, 47 y.o.   MRN: [id number]  HPI: Donna Odonnell is a 47 y.o. female  Chief Complaint  Patient presents with   Hypertension   HYPERTENSION without Chronic Kidney Disease Hypertension status: controlled  Satisfied with current treatment? yes Duration of hypertension: chronic BP monitoring frequency:  weekly BP range: doesn't remember BP medication side effects:  no Medication compliance: excellent compliance Previous BP meds:HCTZ Aspirin: no Recurrent headaches: no Visual changes: no Palpitations: no Dyspnea: no Chest pain: no Lower extremity edema: no Dizzy/lightheaded: no  Relevant past medical, surgical, family and social history reviewed and updated as indicated. Interim medical history since our last visit reviewed. Allergies and medications reviewed and updated.  Review of Systems  Eyes:  Negative for visual disturbance.  Respiratory:  Negative for cough, chest tightness and shortness of breath.   Cardiovascular:  Negative for chest pain, palpitations and leg swelling.  Neurological:  Negative for dizziness and headaches.    Per HPI unless specifically indicated above     Objective:    BP 122/83   Pulse 83   Temp 98 F (36.7 C) (Oral)   Wt 233 lb (105.7 kg)   LMP 08/14/2022 (Exact Date)   SpO2 98%   BMI 39.99 kg/m   Wt Readings from Last 3 Encounters:  09/06/22 233 lb (105.7 kg)  04/18/22 221 lb 1.9 oz (100.3 kg)  04/08/22 221 lb 1.9 oz (100.3 kg)    Physical Exam Vitals and nursing note reviewed.  Constitutional:      General: She is not in acute distress.    Appearance: Normal appearance. She is normal weight. She is not ill-appearing, toxic-appearing or diaphoretic.  HENT:     Head: Normocephalic.     Right Ear: External ear normal.      Left Ear: External ear normal.     Nose: Nose normal.     Mouth/Throat:     Mouth: Mucous membranes are moist.     Pharynx: Oropharynx is clear.  Eyes:     General:        Right eye: No discharge.        Left eye: No discharge.     Extraocular Movements: Extraocular movements intact.     Conjunctiva/sclera: Conjunctivae normal.     Pupils: Pupils are equal, round, and reactive to light.  Cardiovascular:     Rate and Rhythm: Normal rate and regular rhythm.     Heart sounds: No murmur heard. Pulmonary:     Effort: Pulmonary effort is normal. No respiratory distress.     Breath sounds: Normal breath sounds. No wheezing or rales.  Musculoskeletal:     Cervical back: Normal range of motion and neck supple.  Skin:    General: Skin is warm and dry.     Capillary Refill: Capillary refill takes less than 2 seconds.  Neurological:     General: No focal deficit present.     Mental Status: She is alert and oriented to person, place, and time. Mental status is at baseline.  Psychiatric:        Mood and Affect: Mood normal.        Behavior: Behavior normal.  Thought Content: Thought content normal.        Judgment: Judgment normal.     Results for orders placed or performed during the hospital encounter of 04/18/22  CBC  Result Value Ref Range   WBC 17.5 (H) 4.0 - 10.5 K/uL   RBC 3.77 (L) 3.87 - 5.11 MIL/uL   Hemoglobin 10.4 (L) 12.0 - 15.0 g/dL   HCT 32.6 (L) 36.0 - 46.0 %   MCV 86.5 80.0 - 100.0 fL   MCH 27.6 26.0 - 34.0 pg   MCHC 31.9 30.0 - 36.0 g/dL   RDW 14.8 11.5 - 15.5 %   Platelets 246 150 - 400 K/uL   nRBC 0.0 0.0 - 0.2 %  Basic metabolic panel  Result Value Ref Range   Sodium 138 135 - 145 mmol/L   Potassium 4.1 3.5 - 5.1 mmol/L   Chloride 110 98 - 111 mmol/L   CO2 24 22 - 32 mmol/L   Glucose, Bld 176 (H) 70 - 99 mg/dL   BUN 12 6 - 20 mg/dL   Creatinine, Ser 0.73 0.44 - 1.00 mg/dL   Calcium 8.7 (L) 8.9 - 10.3 mg/dL   GFR, Estimated >60 >60 mL/min    Anion gap 4 (L) 5 - 15  Pregnancy, urine POC  Result Value Ref Range   Preg Test, Ur NEGATIVE NEGATIVE  ABO/Rh  Result Value Ref Range   ABO/RH(D)      A POS Performed at [facility], [address], [place], [place] [address]   Surgical pathology  Result Value Ref Range   SURGICAL PATHOLOGY      SURGICAL PATHOLOGY CASE: [id number] PATIENT: [facility] Surgical Pathology Report     Specimen Submitted: A. Femoral head, right  Clinical History: Primary osteoarthritis of right hip.      DIAGNOSIS: A.  FEMORAL HEAD, RIGHT; ARTHROPLASTY: - DEGENERATIVE OSTEOARTHROPATHY.  GROSS DESCRIPTION: A. Labeled: Right femoral head Received: Fresh Collection time: 8:50 AM on 04/18/2022 Placed into formalin time: 11:24 AM on 04/18/2022 Size of specimen:      Head -4.2 x 4.1 x 3.7 cm      Neck -3.4 x 2.7 x 2.7 cm      Additional tissue: None grossly appreciated. Articular surface: The articular surface is tan and smooth with a 1.9 x 1 cm area of roughening. Cut surface: The cut surface is tan-yellow and firm. Other findings: None grossly appreciated.  Block summary: 1 - representative sections of roughening to adjacent cartilage  Tissue decalcification: Cassette 1  RB 04/18/2022   Final Diagnosis performed by Quay Burow, MD.   Electronically signed 1 0/03/2022 1:39:17PM The electronic signature indicates that the named Attending Pathologist has evaluated the specimen Technical component performed at [facility], [address], [place], [place] [address] Lab: [phone] Dir: Rush Farmer, MD, MMM  Professional component performed at [facility], [facility], [place], [place], [place] [address] Lab: [phone] Dir: Kathi Simpers, MD       Assessment & Plan:   Problem List Items Addressed This Visit       Cardiovascular and Mediastinum   Primary hypertension - Primary    Chronic.  Controlled.  Continue with current medication  regimen of HCTZ 12.'5mg'$  daily.  Refills sent today.  Labs ordered today.  Return to clinic in 6 months for reevaluation.  Call sooner if concerns arise.        Relevant Medications   hydrochlorothiazide (HYDRODIURIL) 12.5 MG tablet   Other Relevant Orders  Comprehensive metabolic panel   Other Visit Diagnoses     Elevated LDL cholesterol level       Labs ordered today. Will make recommendations based on lab results.   Relevant Orders   Lipid Profile   Encounter for hepatitis C screening test for low risk patient       Relevant Orders   Hepatitis C Antibody   Screening for HIV (human immunodeficiency virus)       Relevant Orders   HIV Antibody (routine testing w rflx)        Follow up plan: Return in about 6 months (around 03/07/2023) for Physical and Fasting labs.

## 2022-09-07 LAB — COMPREHENSIVE METABOLIC PANEL
ALT: 6 IU/L (ref 0–32)
AST: 12 IU/L (ref 0–40)
Albumin/Globulin Ratio: 1.7 (ref 1.2–2.2)
Albumin: 4.1 g/dL (ref 3.9–4.9)
Alkaline Phosphatase: 53 IU/L (ref 44–121)
BUN/Creatinine Ratio: 16 (ref 9–23)
BUN: 12 mg/dL (ref 6–24)
Bilirubin Total: <0.2 mg/dL (ref 0.0–1.2)
CO2: 22 mmol/L (ref 20–29)
Calcium: 8.8 mg/dL (ref 8.7–10.2)
Chloride: 107 mmol/L — ABNORMAL HIGH (ref 96–106)
Creatinine, Ser: 0.77 mg/dL (ref 0.57–1.00)
Globulin, Total: 2.4 g/dL (ref 1.5–4.5)
Glucose: 98 mg/dL (ref 70–99)
Potassium: 4.1 mmol/L (ref 3.5–5.2)
Sodium: 141 mmol/L (ref 134–144)
Total Protein: 6.5 g/dL (ref 6.0–8.5)
eGFR: 97 mL/min/{1.73_m2} (ref >59)

## 2022-09-07 LAB — LIPID PANEL
Chol/HDL Ratio: 3.2 ratio (ref 0.0–4.4)
Cholesterol, Total: 183 mg/dL (ref 100–199)
HDL: 57 mg/dL (ref >39)
LDL Chol Calc (NIH): 113 mg/dL — ABNORMAL HIGH (ref 0–99)
Triglycerides: 68 mg/dL (ref 0–149)
VLDL Cholesterol Cal: 13 mg/dL (ref 5–40)

## 2022-09-07 LAB — HEPATITIS C ANTIBODY: Hep C Virus Ab: NONREACTIVE

## 2022-09-07 LAB — HIV ANTIBODY (ROUTINE TESTING W REFLEX): HIV Screen 4th Generation wRfx: NONREACTIVE

## 2022-09-09 DIAGNOSIS — Z96641 Presence of right artificial hip joint: Secondary | ICD-10-CM | POA: Diagnosis not present

## 2022-09-09 DIAGNOSIS — M545 Low back pain, unspecified: Secondary | ICD-10-CM | POA: Diagnosis not present

## 2022-09-09 DIAGNOSIS — M1612 Unilateral primary osteoarthritis, left hip: Secondary | ICD-10-CM | POA: Diagnosis not present

## 2022-09-09 NOTE — Progress Notes (Signed)
Hi Donna Odonnell. It was nice to meet you last week.  Your lab work looks good.  Your cholesterol is slightly elevated.  I recommend a low fat diet and exercise.  No concerns at this time. Continue with your current medication regimen.  Follow up as discussed.  Please let me know if you have any questions.

## 2022-09-11 DIAGNOSIS — Z96641 Presence of right artificial hip joint: Secondary | ICD-10-CM | POA: Diagnosis not present

## 2022-09-11 DIAGNOSIS — M1611 Unilateral primary osteoarthritis, right hip: Secondary | ICD-10-CM | POA: Diagnosis not present

## 2022-11-06 ENCOUNTER — Other Ambulatory Visit: Payer: Self-pay

## 2022-11-06 ENCOUNTER — Emergency Department
Admission: EM | Admit: 2022-11-06 | Discharge: 2022-11-06 | Disposition: A | Discharge status: Home / Self Care | Payer: BC Managed Care – PPO | Attending: Emergency Medicine | Admitting: Emergency Medicine

## 2022-11-06 ENCOUNTER — Encounter: Payer: Self-pay | Admitting: Emergency Medicine

## 2022-11-06 DIAGNOSIS — R590 Localized enlarged lymph nodes: Secondary | ICD-10-CM | POA: Insufficient documentation

## 2022-11-06 DIAGNOSIS — I10 Essential (primary) hypertension: Secondary | ICD-10-CM | POA: Insufficient documentation

## 2022-11-06 DIAGNOSIS — K029 Dental caries, unspecified: Secondary | ICD-10-CM | POA: Insufficient documentation

## 2022-11-06 DIAGNOSIS — J029 Acute pharyngitis, unspecified: Secondary | ICD-10-CM | POA: Insufficient documentation

## 2022-11-06 LAB — GROUP A STREP BY PCR: Group A Strep by PCR: NOT DETECTED

## 2022-11-06 MED ORDER — IBUPROFEN 600 MG PO TABS: 600.0000 mg | ORAL_TABLET | Freq: Once | ORAL | Status: DC

## 2022-11-06 NOTE — ED Triage Notes (Signed)
Patient to ED via POV for sore throat. Patient states discomfort since this AM. NAD noted.

## 2022-11-06 NOTE — Discharge Instructions (Signed)
You are seen in the emergency department for sore throat.  You did not have findings of a strep throat.  Most likely have a viral illness.  Stay hydrated and drink plenty of fluids.  Return to the emergency department for any worsening symptoms.  Pain control:  Ibuprofen (motrin/aleve/advil) - You can take 3-4 tablets (600-800 mg) every 6 hours as needed for pain/fever.  Acetaminophen (tylenol) - You can take 2 extra strength tablets (1000 mg) every 6 hours as needed for pain/fever.  You can alternate these medications or take them together.  Make sure you eat food/drink water when taking these medications.  You are found to have an elevated blood pressure in the emergency department.  Is importantly continue to take your blood pressure medication and follow-up closely with your primary care physician within the next 1 week for blood pressure recheck.

## 2022-11-06 NOTE — ED Provider Notes (Signed)
[facility] Provider Note    Event Date/Time   First MD Initiated Contact with Patient 11/06/22 1742     (approximate)   History   Sore Throat   HPI  Donna Odonnell is a 47 y.o. female no significant past medical history who presents to the emergency department with sore throat.  Sore throat started earlier today.  Sore throat that is worse on the left side.  Denies any fever or chills.  No difficulty swallowing.  Dental pain to the right back tooth.  States that she has not recently been evaluated by dentist.   Physical Exam   Triage Vital Signs: ED Triage Vitals  Enc Vitals Group     BP 11/06/22 1740 (!) 165/107     Pulse Rate 11/06/22 1740 90     Resp 11/06/22 1740 18     Temp 11/06/22 1740 99.2 F (37.3 C)     Temp Source 11/06/22 1740 Oral     SpO2 11/06/22 1740 100 %     Weight --      Height --      Head Circumference --      Peak Flow --      Pain Score 11/06/22 1739 4     Pain Loc --      Pain Edu? --      Excl. in GC? --     Most recent vital signs: Vitals:   11/06/22 1740  BP: (!) 165/107  Pulse: 90  Resp: 18  Temp: 99.2 F (37.3 C)  SpO2: 100%    Physical Exam Constitutional:      Appearance: She is well-developed.  HENT:     Head: Atraumatic.     Mouth/Throat:     Mouth: Mucous membranes are moist.     Dentition: Abnormal dentition. Dental caries present.     Pharynx: Oropharynx is clear. Uvula midline. Posterior oropharyngeal erythema present. No oropharyngeal exudate or uvula swelling.     Tonsils: No tonsillar exudate or tonsillar abscesses. 1+ on the right. 1+ on the left.     Comments: Mild cervical lymphadenopathy to the left.  No periapical abscess.  No fullness under the tongue and no tenderness under the tongue. Eyes:     Conjunctiva/sclera: Conjunctivae normal.  Cardiovascular:     Rate and Rhythm: Regular rhythm.  Pulmonary:     Effort: No respiratory distress.  Abdominal:     General: There is no  distension.  Musculoskeletal:        General: Normal range of motion.     Cervical back: Normal range of motion.  Skin:    General: Skin is warm.  Neurological:     Mental Status: She is alert. Mental status is at baseline.      IMPRESSION / MDM / ASSESSMENT AND PLAN / ED COURSE  I reviewed the triage vital signs and the nursing notes.  Differential diagnosis including strep throat, viral pharyngitis, dental caries.  No signs of a PTA or RPA on exam.  No trismus.  Clinical picture is not consistent with [place].   Patient does have poor dentition with a dental carry to the right back tooth however this is not consistent with her sore throat.  Plan for strep testing.    Labs (all labs ordered are listed, but only abnormal results are displayed) Labs interpreted as -    Labs Reviewed  GROUP A STREP BY PCR   Patient was given Motrin for pain control.  Strep testing negative.  Most likely with viral illness.  Discussed symptomatic treatment with Tylenol and Motrin.  On arrival patient was found to be hypertensive, states that she does have a primary care provider and takes HCTZ 12.5 mg tablet.  States that she has prescriptions for this medication.  Discussed close follow-up with her primary care physician for reevaluation of her blood pressure within the next 1 week.  Given information for low-cost dentist in the area to follow-up for her dental caries and poor dentition.  Given return precautions for any progressing symptoms.     PROCEDURES:  Critical Care performed: No  Procedures  Patient's presentation is most consistent with acute illness / injury with system symptoms.   MEDICATIONS ORDERED IN ED: Medications  ibuprofen (ADVIL) tablet 600 mg (has no administration in time range)    FINAL CLINICAL IMPRESSION(S) / ED DIAGNOSES   Final diagnoses:  Pharyngitis, unspecified etiology  Hypertension, unspecified type  Uncontrolled hypertension     Rx / DC Orders    ED Discharge Orders     None        Note:  This document was prepared using Dragon voice recognition software and may include unintentional dictation errors.   Corena Herter, MD 11/06/22 [phone]

## 2022-11-21 ENCOUNTER — Encounter: Payer: Self-pay | Admitting: Nurse Practitioner

## 2023-02-24 ENCOUNTER — Encounter: Payer: Self-pay | Admitting: Nurse Practitioner

## 2023-02-27 ENCOUNTER — Emergency Department
Admission: EM | Admit: 2023-02-27 | Discharge: 2023-02-27 | Disposition: A | Discharge status: Home / Self Care | Payer: Self-pay | Attending: Emergency Medicine | Admitting: Emergency Medicine

## 2023-02-27 ENCOUNTER — Ambulatory Visit: Payer: Self-pay | Admitting: Physician Assistant

## 2023-02-27 ENCOUNTER — Other Ambulatory Visit: Payer: Self-pay

## 2023-02-27 DIAGNOSIS — R21 Rash and other nonspecific skin eruption: Secondary | ICD-10-CM | POA: Insufficient documentation

## 2023-02-27 MED ORDER — TRIAMCINOLONE ACETONIDE 0.025 % EX OINT: 1.0000 | TOPICAL_OINTMENT | Freq: Two times a day (BID) | CUTANEOUS | 0 refills | Status: DC

## 2023-02-27 NOTE — ED Triage Notes (Signed)
Pt to ED via POV from home. Pt reports rash that started as an itch that has been getting worse x1 wk. Pt reports rash on right arm, right leg and right side of chest. Pt reports the only thing she changed was using palmers coco butter lotion. Pt denies SOB or CP

## 2023-02-27 NOTE — Discharge Instructions (Signed)
Take a daily allergy medication like Claritin, Allegra or Zyrtec.  This will help with the itching symptoms.  You can apply the triamcinolone ointment twice daily.  Use a nonscented lotion.  Please return to the ED if you develop any signs of anaphylaxis like tongue swelling, lip swelling, difficulty breathing, nausea or vomiting.

## 2023-02-27 NOTE — ED Provider Notes (Signed)
[facility] Provider Note    Event Date/Time   First MD Initiated Contact with Patient 02/27/23 1159     (approximate)   History   Rash   HPI  Donna Odonnell is a 47 y.o. female presents for evaluation of a rash that started about a week ago.  Patient states it has been itchy but not painful.  It began on her arm then she developed a spot on her right chest and on her right lower leg.  Patient has not spent any time outside and does not believe she came in contact with anything abnormal.  She has had no changes to her routine other than using Palmer's cocoa butter lotion.  Patient denies any shortness of breath, chest pain, lip swelling, tongue swelling, nausea, vomiting, fever.      Physical Exam   Triage Vital Signs: ED Triage Vitals  Encounter Vitals Group     BP 02/27/23 1108 (!) 156/103     Systolic BP Percentile --      Diastolic BP Percentile --      Pulse Rate 02/27/23 1108 66     Resp 02/27/23 1108 16     Temp 02/27/23 1108 98.9 F (37.2 C)     Temp Source 02/27/23 1108 Oral     SpO2 02/27/23 1108 99 %     Weight 02/27/23 1151 233 lb 0.4 oz (105.7 kg)     Height 02/27/23 1151 5\' 4"  (1.626 m)     Head Circumference --      Peak Flow --      Pain Score 02/27/23 1116 0     Pain Loc --      Pain Education --      Exclude from Growth Chart --     Most recent vital signs: Vitals:   02/27/23 1108  BP: (!) 156/103  Pulse: 66  Resp: 16  Temp: 98.9 F (37.2 C)  SpO2: 99%    General: Awake, no distress.  CV:  Good peripheral perfusion. RRR. Resp:  Normal effort. CTAB.  Abd:  No distention.  Other:  Maculopapular rash on the right posterior forearm and distal right lower leg, round scaly patch on right chest that appears to be healing.   ED Results / Procedures / Treatments   Labs (all labs ordered are listed, but only abnormal results are displayed) Labs Reviewed - No data to display    PROCEDURES:  Critical Care  performed: No  Procedures   MEDICATIONS ORDERED IN ED: Medications - No data to display   IMPRESSION / MDM / ASSESSMENT AND PLAN / ED COURSE  I reviewed the triage vital signs and the nursing notes.                              Differential diagnosis includes, but is not limited to, heat rash, contact dermatitis, atopic dermatitis, allergic dermatitis.  Patient's presentation is most consistent with acute, uncomplicated illness.  I suspect patient's rash is a form of contact dermatitis.  Given her stable vital signs and lack of fever I am not concerned that her rash is life-threatening.  I will send a prescription for a topical steroid.  I also advised patient to take a daily allergy medication and use a moisturizing lotion for sensitive skin without scents or dyes.  Patient voiced understanding, all questions were answered and she was stable at discharge.  FINAL CLINICAL IMPRESSION(S) / ED DIAGNOSES   Final diagnoses:  Rash     Rx / DC Orders   ED Discharge Orders          Ordered    triamcinolone (KENALOG) 0.025 % ointment  2 times daily        02/27/23 1231             Note:  This document was prepared using Dragon voice recognition software and may include unintentional dictation errors.   Cameron Ali, PA-C 02/27/23 1233    Chesley Noon, MD 02/27/23 1921

## 2023-03-04 ENCOUNTER — Telehealth: Payer: Self-pay

## 2023-03-04 NOTE — Transitions of Care (Post Inpatient/ED Visit) (Unsigned)
   03/04/2023  Name: Donna Odonnell MRN: [id number] DOB: October 16, 1975  Today's TOC FU Call Status: Today's TOC FU Call Status:: Unsuccessful Call (1st Attempt) Unsuccessful Call (1st Attempt) Date: 03/04/23  Attempted to reach the patient regarding the most recent Inpatient/ED visit.  Follow Up Plan: Additional outreach attempts will be made to reach the patient to complete the Transitions of Care (Post Inpatient/ED visit) call.   Signature: Wilhemena Durie, CMA

## 2023-03-05 NOTE — Transitions of Care (Post Inpatient/ED Visit) (Unsigned)
   03/05/2023  Name: Donna Odonnell MRN: [id number] DOB: 1975/10/04  Today's TOC FU Call Status: Today's TOC FU Call Status:: Unsuccessful Call (2nd Attempt) Unsuccessful Call (1st Attempt) Date: 03/04/23 Unsuccessful Call (2nd Attempt) Date: 03/05/23  Attempted to reach the patient regarding the most recent Inpatient/ED visit.  Follow Up Plan: Additional outreach attempts will be made to reach the patient to complete the Transitions of Care (Post Inpatient/ED visit) call.   Signature: Wilhemena Durie, CMA

## 2023-03-06 NOTE — Transitions of Care (Post Inpatient/ED Visit) (Signed)
   03/06/2023  Name: Donna Odonnell MRN: [id number] DOB: June 24, 1976  Today's TOC FU Call Status: Today's TOC FU Call Status:: Unsuccessful Call (3rd Attempt) Unsuccessful Call (1st Attempt) Date: 03/04/23 Unsuccessful Call (2nd Attempt) Date: 03/05/23 Unsuccessful Call (3rd Attempt) Date: 03/06/23  Attempted to reach the patient regarding the most recent Inpatient/ED visit.  Follow Up Plan: No further outreach attempts will be made at this time. We have been unable to contact the patient.  Signature: Wilhemena Durie, CMA

## 2023-03-07 ENCOUNTER — Telehealth: Payer: Self-pay | Admitting: Nurse Practitioner

## 2023-03-07 NOTE — Telephone Encounter (Unsigned)
Copied from CRM [id number]. Topic: General - Other >> Mar 07, 2023  1:15 PM Jannifer Rodney M wrote: Reason for CRM: Pt stated she was returning call to [place]. Pt requests call back at [phone]

## 2023-03-10 ENCOUNTER — Telehealth: Payer: Self-pay | Admitting: Nurse Practitioner

## 2023-03-10 NOTE — Telephone Encounter (Signed)
Copied from CRM [id number]. Topic: General - Inquiry >> Mar 10, 2023  3:29 PM Donna Odonnell wrote: Reason for CRM: patient called stated she needs [place] to call her back between 12:30-1:30 or around 3:30. She does not feel she was diagnosed correctly at the ER. Please f/u with patient

## 2023-03-10 NOTE — Telephone Encounter (Signed)
Can we call patient and get her a follow up apt. She has not been seen in office in 6 months and has been in ED last week

## 2023-03-12 NOTE — Telephone Encounter (Signed)
Attempted to reach patient, LVM to call office back to get scheduled regarding ED visit.  Put in CRM.

## 2023-04-12 ENCOUNTER — Other Ambulatory Visit: Payer: Self-pay

## 2023-04-12 ENCOUNTER — Emergency Department
Admission: EM | Admit: 2023-04-12 | Discharge: 2023-04-12 | Disposition: A | Discharge status: Home / Self Care | Payer: Self-pay | Attending: Emergency Medicine | Admitting: Emergency Medicine

## 2023-04-12 ENCOUNTER — Emergency Department: Payer: Self-pay

## 2023-04-12 DIAGNOSIS — J069 Acute upper respiratory infection, unspecified: Secondary | ICD-10-CM | POA: Insufficient documentation

## 2023-04-12 DIAGNOSIS — Z20822 Contact with and (suspected) exposure to covid-19: Secondary | ICD-10-CM | POA: Insufficient documentation

## 2023-04-12 LAB — BASIC METABOLIC PANEL
Anion gap: 8 (ref 5–15)
BUN: 10 mg/dL (ref 6–20)
CO2: 23 mmol/L (ref 22–32)
Calcium: 8.4 mg/dL — ABNORMAL LOW (ref 8.9–10.3)
Chloride: 106 mmol/L (ref 98–111)
Creatinine, Ser: 0.91 mg/dL (ref 0.44–1.00)
GFR, Estimated: >60 mL/min (ref >60)
Glucose, Bld: 95 mg/dL (ref 70–99)
Potassium: 4.2 mmol/L (ref 3.5–5.1)
Sodium: 137 mmol/L (ref 135–145)

## 2023-04-12 LAB — CBC WITH DIFFERENTIAL/PLATELET
Abs Immature Granulocytes: 0.01 10*3/uL (ref 0.00–0.07)
Basophils Absolute: 0 10*3/uL (ref 0.0–0.1)
Basophils Relative: 1 %
Eosinophils Absolute: 0.1 10*3/uL (ref 0.0–0.5)
Eosinophils Relative: 2 %
HCT: 39.4 % (ref 36.0–46.0)
Hemoglobin: 12.9 g/dL (ref 12.0–15.0)
Immature Granulocytes: 0 %
Lymphocytes Relative: 45 %
Lymphs Abs: 2.2 10*3/uL (ref 0.7–4.0)
MCH: 27.6 pg (ref 26.0–34.0)
MCHC: 32.7 g/dL (ref 30.0–36.0)
MCV: 84.2 fL (ref 80.0–100.0)
Monocytes Absolute: 0.5 10*3/uL (ref 0.1–1.0)
Monocytes Relative: 11 %
Neutro Abs: 2 10*3/uL (ref 1.7–7.7)
Neutrophils Relative %: 41 %
Platelets: 300 10*3/uL (ref 150–400)
RBC: 4.68 MIL/uL (ref 3.87–5.11)
RDW: 14.7 % (ref 11.5–15.5)
WBC: 4.8 10*3/uL (ref 4.0–10.5)
nRBC: 0 % (ref 0.0–0.2)

## 2023-04-12 LAB — RESP PANEL BY RT-PCR (RSV, FLU A&B, COVID)  RVPGX2
Influenza A by PCR: NEGATIVE
Influenza B by PCR: NEGATIVE
Resp Syncytial Virus by PCR: NEGATIVE
SARS Coronavirus 2 by RT PCR: NEGATIVE

## 2023-04-12 NOTE — ED Provider Notes (Signed)
[facility] Provider Note    Event Date/Time   First MD Initiated Contact with Patient 04/12/23 1430     (approximate)   History   Fever (SOB, productive cough)   HPI Donna Odonnell is a 47 y.o. female presenting today for fever and cough.  Patient notes symptom onset 2 days ago with a dry cough.  Felt some bodyaches and then started having productivity with her cough that was clear sputum.  Reportedly had a fever at 100.4 F earlier today.  Has not taken any medication for this.  Otherwise denies nasal discharge, chest pain, nausea, vomiting, abdominal pain, sore throat.     Physical Exam   Triage Vital Signs: ED Triage Vitals [04/12/23 1416]  Encounter Vitals Group     BP (!) 140/97     Systolic BP Percentile      Diastolic BP Percentile      Pulse Rate 96     Resp      Temp 98 F (36.7 C)     Temp Source Oral     SpO2 99 %     Weight 240 lb (108.9 kg)     Height 5\' 5"  (1.651 m)     Head Circumference      Peak Flow      Pain Score 0     Pain Loc      Pain Education      Exclude from Growth Chart     Most recent vital signs: Vitals:   04/12/23 1416  BP: (!) 140/97  Pulse: 96  Temp: 98 F (36.7 C)  SpO2: 99%   Physical Exam: I have reviewed the vital signs and nursing notes. General: Awake, alert, no acute distress.  Nontoxic appearing. Head:  Atraumatic, normocephalic.   ENT:  EOM intact, PERRL. Oral mucosa is pink and moist with no lesions. Neck: Neck is supple with full range of motion, No meningeal signs. Cardiovascular:  RRR, No murmurs. Peripheral pulses palpable and equal bilaterally. Respiratory:  Symmetrical chest wall expansion.  No rhonchi, rales, or wheezes.  Good air movement throughout.  No use of accessory muscles.   Musculoskeletal:  No cyanosis or edema. Moving extremities with full ROM Abdomen:  Soft, nontender, nondistended. Neuro:  GCS 15, moving all four extremities, interacting appropriately. Speech  clear. Psych:  Calm, appropriate.   Skin:  Warm, dry, no rash.    ED Results / Procedures / Treatments   Labs (all labs ordered are listed, but only abnormal results are displayed) Labs Reviewed  BASIC METABOLIC PANEL - Abnormal; Notable for the following components:      Result Value   Calcium 8.4 (*)    All other components within normal limits  RESP PANEL BY RT-PCR (RSV, FLU A&B, COVID)  [id number]  CBC WITH DIFFERENTIAL/PLATELET     EKG My EKG interpretation: Rate of 93, normal sinus rhythm, normal axis, normal intervals.  No acute ST elevations or depressions   RADIOLOGY Independently interpreted chest x-ray with no acute pathology   PROCEDURES:  Critical Care performed: No  Procedures   MEDICATIONS ORDERED IN ED: Medications - No data to display   IMPRESSION / MDM / ASSESSMENT AND PLAN / ED COURSE  I reviewed the triage vital signs and the nursing notes.                              Differential diagnosis includes, but is not  limited to, viral URI with cough, pneumonia, viral pharyngitis.  Patient's presentation is most consistent with acute complicated illness / injury requiring diagnostic workup.  Patient is a 47 year old female presenting today for fever, cough, and bodyaches.  Vital signs stable on arrival and physical exam largely unremarkable.  CBC and BMP unremarkable.  Chest x-ray shows no evidence of pneumonia.  EKG reassuring.  Negative for COVID, flu, and RSV.  Symptoms today are still most consistent with viral upper respiratory infection.  Patient otherwise stable for discharge I discussed symptomatic management at home.  Told to follow-up with PCP and given strict return precautions.  The patient is on the cardiac monitor to evaluate for evidence of arrhythmia and/or significant heart rate changes. Clinical Course as of 04/12/23 1542  Sat Apr 12, 2023  1456 Basic metabolic panel(!) Unremarkable [DW]  1456 CBC with Differential Unremarkable [DW]   1507 Independently interpreted chest x-ray with no acute pathology [DW]    Clinical Course User Index [DW] Janith Lima, MD     FINAL CLINICAL IMPRESSION(S) / ED DIAGNOSES   Final diagnoses:  Viral URI with cough     Rx / DC Orders   ED Discharge Orders     None        Note:  This document was prepared using Dragon voice recognition software and may include unintentional dictation errors.   Janith Lima, MD 04/12/23 [phone]

## 2023-04-12 NOTE — Discharge Instructions (Addendum)
You were seen in the emergency department today for your fever, cough, and bodyaches.  Laboratory workup is reassuring today and chest x-ray shows no evidence of pneumonia.  I do suspect this is a viral upper respiratory infection at this time.  You can take Tylenol and ibuprofen along with decongestants at home as needed for symptoms.  Please stay hydrated.  Please follow-up with your primary care provider as needed.  Please return for worsening symptoms.

## 2023-04-12 NOTE — ED Triage Notes (Addendum)
Pt c/o fever, productive cough, body aches since Thursday. States she has chest pain when coughing. Fever of 100.4; none since.

## 2023-04-16 ENCOUNTER — Encounter: Payer: Self-pay | Admitting: Pediatrics

## 2023-07-10 ENCOUNTER — Encounter: Payer: Self-pay | Admitting: Nurse Practitioner

## 2023-08-06 ENCOUNTER — Other Ambulatory Visit: Payer: Self-pay | Admitting: Nurse Practitioner

## 2023-08-06 DIAGNOSIS — Z1231 Encounter for screening mammogram for malignant neoplasm of breast: Secondary | ICD-10-CM

## 2023-08-13 ENCOUNTER — Ambulatory Visit
Admission: RE | Admit: 2023-08-13 | Discharge: 2023-08-13 | Disposition: A | Discharge status: Home / Self Care | Payer: 59 | Source: Ambulatory Visit | Attending: Nurse Practitioner | Admitting: Nurse Practitioner

## 2023-08-13 DIAGNOSIS — Z1231 Encounter for screening mammogram for malignant neoplasm of breast: Secondary | ICD-10-CM | POA: Insufficient documentation

## 2023-08-18 ENCOUNTER — Other Ambulatory Visit: Payer: Self-pay | Admitting: Nurse Practitioner

## 2023-08-18 DIAGNOSIS — R928 Other abnormal and inconclusive findings on diagnostic imaging of breast: Secondary | ICD-10-CM

## 2023-08-22 ENCOUNTER — Ambulatory Visit
Admission: RE | Admit: 2023-08-22 | Discharge: 2023-08-22 | Disposition: A | Discharge status: Home / Self Care | Payer: 59 | Source: Ambulatory Visit | Attending: Nurse Practitioner | Admitting: Nurse Practitioner

## 2023-08-22 DIAGNOSIS — R928 Other abnormal and inconclusive findings on diagnostic imaging of breast: Secondary | ICD-10-CM

## 2023-08-25 ENCOUNTER — Ambulatory Visit: Payer: 59 | Admitting: Nurse Practitioner

## 2023-08-25 ENCOUNTER — Encounter: Payer: Self-pay | Admitting: Nurse Practitioner

## 2023-08-25 VITALS — BP 132/90 | HR 90 | Temp 100.1°F | Ht 65.0 in | Wt 233.8 lb

## 2023-08-25 DIAGNOSIS — Z23 Encounter for immunization: Secondary | ICD-10-CM | POA: Diagnosis not present

## 2023-08-25 DIAGNOSIS — I10 Essential (primary) hypertension: Secondary | ICD-10-CM

## 2023-08-25 DIAGNOSIS — N921 Excessive and frequent menstruation with irregular cycle: Secondary | ICD-10-CM | POA: Diagnosis not present

## 2023-08-25 LAB — URINALYSIS, ROUTINE W REFLEX MICROSCOPIC
Bilirubin, UA: NEGATIVE
Glucose, UA: NEGATIVE
Ketones, UA: NEGATIVE
Nitrite, UA: NEGATIVE
Protein,UA: NEGATIVE
Specific Gravity, UA: 1.025 (ref 1.005–1.030)
Urobilinogen, Ur: 0.2 mg/dL (ref 0.2–1.0)
pH, UA: 6 (ref 5.0–7.5)

## 2023-08-25 LAB — MICROSCOPIC EXAMINATION: RBC, Urine: NONE SEEN /[HPF] (ref 0–2)

## 2023-08-25 LAB — WET PREP FOR TRICH, YEAST, CLUE
Clue Cell Exam: POSITIVE — AB
Trichomonas Exam: NEGATIVE
Yeast Exam: POSITIVE — AB

## 2023-08-25 MED ORDER — METRONIDAZOLE 500 MG PO TABS
500.0000 mg | ORAL_TABLET | Freq: Two times a day (BID) | ORAL | 0 refills | Status: DC
Start: 2023-08-25 — End: 2023-09-01

## 2023-08-25 MED ORDER — FLUCONAZOLE 150 MG PO TABS: 150.0000 mg | ORAL_TABLET | Freq: Once | ORAL | 0 refills | Status: AC

## 2023-08-25 NOTE — Assessment & Plan Note (Signed)
 Chronic. Not well controlled.  Patient decided not to take the HCTZ and wants to work on it on her own.

## 2023-08-25 NOTE — Addendum Note (Signed)
 Addended by: Aileen Alexanders on: 08/25/2023 04:00 PM   Modules accepted: Orders

## 2023-08-25 NOTE — Progress Notes (Signed)
 BP (!) 132/90 (BP Location: Right Arm, Patient Position: Sitting, Cuff Size: Large)   Pulse 90   Temp 100.1 F (37.8 C) (Oral)   Ht 5\' 5"  (1.651 m)   Wt 233 lb 12.8 oz (106.1 kg)   LMP 08/12/2023   SpO2 99%   BMI 38.91 kg/m    Subjective:    Patient ID: Donna Odonnell, female    DOB: 12/28/1975, 48 y.o.   MRN: [id number]  HPI: Donna Odonnell is a 48 y.o. female  Chief Complaint  Patient presents with   Gynecologic Exam    Has had 2 cycles with one month Dec. 11th and 26th, 2024 and are extra heavy now.   Patient states she had two periods in December 11th and lasted 5-6 days and again on December 26 it lasted for 5-6 days.  States for the last couple of times it has been heavier.  Denies any urination issues.  Denies any abnormal vaginal discharge or foul odor.  Denies feelings of depression, lower extremity swelling, palpitations, weight gain and weight loss.     Relevant past medical, surgical, family and social history reviewed and updated as indicated. Interim medical history since our last visit reviewed. Allergies and medications reviewed and updated.  Review of Systems  Constitutional:  Negative for unexpected weight change.  Cardiovascular:  Negative for palpitations and leg swelling.  Genitourinary:  Positive for menstrual problem and vaginal bleeding. Negative for dysuria, pelvic pain and vaginal discharge.  Psychiatric/Behavioral:  Negative for dysphoric mood. The patient is not nervous/anxious.     Per HPI unless specifically indicated above     Objective:    BP (!) 132/90 (BP Location: Right Arm, Patient Position: Sitting, Cuff Size: Large)   Pulse 90   Temp 100.1 F (37.8 C) (Oral)   Ht 5\' 5"  (1.651 m)   Wt 233 lb 12.8 oz (106.1 kg)   LMP 08/12/2023   SpO2 99%   BMI 38.91 kg/m   Wt Readings from Last 3 Encounters:  08/25/23 233 lb 12.8 oz (106.1 kg)  04/12/23 240 lb (108.9 kg)  02/27/23 233 lb 0.4 oz (105.7 kg)    Physical Exam Vitals and  nursing note reviewed.  Constitutional:      General: She is not in acute distress.    Appearance: Normal appearance. She is obese. She is not ill-appearing, toxic-appearing or diaphoretic.  HENT:     Head: Normocephalic.     Right Ear: External ear normal.     Left Ear: External ear normal.     Nose: Nose normal.     Mouth/Throat:     Mouth: Mucous membranes are moist.     Pharynx: Oropharynx is clear.  Eyes:     General:        Right eye: No discharge.        Left eye: No discharge.     Extraocular Movements: Extraocular movements intact.     Conjunctiva/sclera: Conjunctivae normal.     Pupils: Pupils are equal, round, and reactive to light.  Cardiovascular:     Rate and Rhythm: Normal rate and regular rhythm.     Heart sounds: No murmur heard. Pulmonary:     Effort: Pulmonary effort is normal. No respiratory distress.     Breath sounds: Normal breath sounds. No wheezing or rales.  Musculoskeletal:     Cervical back: Normal range of motion and neck supple.  Skin:    General: Skin is warm and dry.  Capillary Refill: Capillary refill takes less than 2 seconds.  Neurological:     General: No focal deficit present.     Mental Status: She is alert and oriented to person, place, and time. Mental status is at baseline.  Psychiatric:        Mood and Affect: Mood normal.        Behavior: Behavior normal.        Thought Content: Thought content normal.        Judgment: Judgment normal.     Results for orders placed or performed during the hospital encounter of 04/12/23  Basic metabolic panel   Collection Time: 04/12/23  2:19 PM  Result Value Ref Range   Sodium 137 135 - 145 mmol/L   Potassium 4.2 3.5 - 5.1 mmol/L   Chloride 106 98 - 111 mmol/L   CO2 23 22 - 32 mmol/L   Glucose, Bld 95 70 - 99 mg/dL   BUN 10 6 - 20 mg/dL   Creatinine, Ser 1.61 0.44 - 1.00 mg/dL   Calcium 8.4 (L) 8.9 - 10.3 mg/dL   GFR, Estimated >09 >60 mL/min   Anion gap 8 5 - 15  CBC with  Differential   Collection Time: 04/12/23  2:19 PM  Result Value Ref Range   WBC 4.8 4.0 - 10.5 K/uL   RBC 4.68 3.87 - 5.11 MIL/uL   Hemoglobin 12.9 12.0 - 15.0 g/dL   HCT 45.4 09.8 - 11.9 %   MCV 84.2 80.0 - 100.0 fL   MCH 27.6 26.0 - 34.0 pg   MCHC 32.7 30.0 - 36.0 g/dL   RDW 14.7 82.9 - 56.2 %   Platelets 300 150 - 400 K/uL   nRBC 0.0 0.0 - 0.2 %   Neutrophils Relative % 41 %   Neutro Abs 2.0 1.7 - 7.7 K/uL   Lymphocytes Relative 45 %   Lymphs Abs 2.2 0.7 - 4.0 K/uL   Monocytes Relative 11 %   Monocytes Absolute 0.5 0.1 - 1.0 K/uL   Eosinophils Relative 2 %   Eosinophils Absolute 0.1 0.0 - 0.5 K/uL   Basophils Relative 1 %   Basophils Absolute 0.0 0.0 - 0.1 K/uL   Immature Granulocytes 0 %   Abs Immature Granulocytes 0.01 0.00 - 0.07 K/uL  Resp panel by RT-PCR (RSV, Flu A&B, Covid) Anterior Nasal Swab   Collection Time: 04/12/23  2:22 PM   Specimen: Anterior Nasal Swab  Result Value Ref Range   SARS Coronavirus 2 by RT PCR NEGATIVE NEGATIVE   Influenza A by PCR NEGATIVE NEGATIVE   Influenza B by PCR NEGATIVE NEGATIVE   Resp Syncytial Virus by PCR NEGATIVE NEGATIVE      Assessment & Plan:   Problem List Items Addressed This Visit       Cardiovascular and Mediastinum   Primary hypertension - Primary   Chronic. Not well controlled.  Patient decided not to take the HCTZ and wants to work on it on her own.        Other Visit Diagnoses       Menorrhagia with irregular cycle       Labs ordered at visit today.  US  ordered to rule out uterine cause of heaby menstrual cycle.  Will make recommendations based on results.   Relevant Orders   WET PREP FOR TRICH, YEAST, CLUE   Urinalysis, Routine w reflex microscopic   US  Pelvic Complete With Transvaginal     Need for influenza vaccination  Relevant Orders   Flu vaccine trivalent PF, 6mos and older(Flulaval,Afluria,Fluarix,Fluzone) (Completed)        Follow up plan: No follow-ups on file.

## 2023-08-27 ENCOUNTER — Other Ambulatory Visit: Payer: Self-pay

## 2023-08-27 ENCOUNTER — Emergency Department
Admission: EM | Admit: 2023-08-27 | Discharge: 2023-08-27 | Disposition: A | Discharge status: Home / Self Care | Payer: 59 | Attending: Student in an Organized Health Care Education/Training Program | Admitting: Student in an Organized Health Care Education/Training Program

## 2023-08-27 DIAGNOSIS — M545 Low back pain, unspecified: Secondary | ICD-10-CM | POA: Diagnosis not present

## 2023-08-27 DIAGNOSIS — J101 Influenza due to other identified influenza virus with other respiratory manifestations: Secondary | ICD-10-CM | POA: Diagnosis not present

## 2023-08-27 DIAGNOSIS — I10 Essential (primary) hypertension: Secondary | ICD-10-CM | POA: Insufficient documentation

## 2023-08-27 DIAGNOSIS — R059 Cough, unspecified: Secondary | ICD-10-CM | POA: Diagnosis present

## 2023-08-27 LAB — URINALYSIS, ROUTINE W REFLEX MICROSCOPIC
Bacteria, UA: NONE SEEN
Bilirubin Urine: NEGATIVE
Glucose, UA: NEGATIVE mg/dL
Ketones, ur: NEGATIVE mg/dL
Nitrite: NEGATIVE
Protein, ur: 30 mg/dL — AB
Specific Gravity, Urine: 1.03 (ref 1.005–1.030)
pH: 5 (ref 5.0–8.0)

## 2023-08-27 LAB — RESP PANEL BY RT-PCR (RSV, FLU A&B, COVID)  RVPGX2
Influenza A by PCR: NEGATIVE
Influenza B by PCR: POSITIVE — AB
Resp Syncytial Virus by PCR: NEGATIVE
SARS Coronavirus 2 by RT PCR: NEGATIVE

## 2023-08-27 MED ORDER — PSEUDOEPH-BROMPHEN-DM 30-2-10 MG/5ML PO SYRP: 5.0000 mL | ORAL_SOLUTION | Freq: Four times a day (QID) | ORAL | 0 refills | Status: DC | PRN

## 2023-08-27 NOTE — ED Provider Notes (Signed)
[facility] Provider Note    Event Date/Time   First MD Initiated Contact with Patient 08/27/23 [phone]     (approximate)   History   Cough   HPI  Donna Odonnell is a 48 y.o. female   presents to the ED with complaint of cough, congestion, low back pain and a temperature from 99-101 at home.  Patient has a history of hypertension, GERD and patient has a history of UTIs.      Physical Exam   Triage Vital Signs: ED Triage Vitals  Encounter Vitals Group     BP 08/27/23 0752 137/86     Systolic BP Percentile --      Diastolic BP Percentile --      Pulse Rate 08/27/23 0752 (!) 103     Resp 08/27/23 0752 18     Temp 08/27/23 0752 99.1 F (37.3 C)     Temp src --      SpO2 08/27/23 0752 96 %     Weight 08/27/23 0750 233 lb (105.7 kg)     Height 08/27/23 0750 5\' 5"  (1.651 m)     Head Circumference --      Peak Flow --      Pain Score 08/27/23 0750 5     Pain Loc --      Pain Education --      Exclude from Growth Chart --     Most recent vital signs: Vitals:   08/27/23 0752  BP: 137/86  Pulse: (!) 103  Resp: 18  Temp: 99.1 F (37.3 C)  SpO2: 96%     General: Awake, no distress.  CV:  Good peripheral perfusion.  Heart regular rate and rhythm. Resp:  Normal effort. .  Lungs clear bilaterally. Abd:  No distention.  No CVA tenderness. Other:     ED Results / Procedures / Treatments   Labs (all labs ordered are listed, but only abnormal results are displayed) Labs Reviewed  RESP PANEL BY RT-PCR (RSV, FLU A&B, COVID)  RVPGX2 - Abnormal; Notable for the following components:      Result Value   Influenza B by PCR POSITIVE (*)    All other components within normal limits  URINALYSIS, ROUTINE W REFLEX MICROSCOPIC - Abnormal; Notable for the following components:   Color, Urine AMBER (*)    APPearance CLOUDY (*)    Hgb urine dipstick SMALL (*)    Protein, ur 30 (*)    Leukocytes,Ua TRACE (*)    All other components within normal limits      PROCEDURES:  Critical Care performed:   Procedures   MEDICATIONS ORDERED IN ED: Medications - No data to display   IMPRESSION / MDM / ASSESSMENT AND PLAN / ED COURSE  I reviewed the triage vital signs and the nursing notes.   Differential diagnosis includes, but is not limited to, COVID, influenza, RSV, viral illness, urinary tract infection.  48 year old female presents to the ED with complaint of sudden onset cough, congestion, body aches including her back.  Patient has had a low-grade temp at home.  Urinalysis was negative.  Respiratory panel positive for influenza B.  Patient was made aware.  We discussed increasing fluids, Bromfed-DM for cough, Tylenol and ibuprofen as needed for body aches and fever.  A note was written for her to remain out of work.      Patient's presentation is most consistent with acute complicated illness / injury requiring diagnostic workup.  FINAL CLINICAL IMPRESSION(S) /  ED DIAGNOSES   Final diagnoses:  Influenza B     Rx / DC Orders   ED Discharge Orders          Ordered    brompheniramine-pseudoephedrine-DM 30-2-10 MG/5ML syrup  4 times daily PRN        08/27/23 0901             Note:  This document was prepared using Dragon voice recognition software and may include unintentional dictation errors.   Tommi Rumps, PA-C 08/27/23 1308    Willy Eddy, MD 08/27/23 [phone]

## 2023-08-27 NOTE — ED Notes (Signed)
On assessment, patient is in ED for cough, generalized weakness, and pain behind the eyes since Saturday. Patient breathing is unlabored with no apparent accessory muscle usage. A&O x4.

## 2023-08-27 NOTE — Discharge Instructions (Signed)
Follow-up with your primary care provider or urgent care if any continued problems.  Increase fluids to stay hydrated.  Tylenol or ibuprofen as needed for body aches, headache or fever.  A prescription was sent to the pharmacy for cough and congestion.

## 2023-08-27 NOTE — ED Triage Notes (Signed)
Pt comes with c/o cough congestion and lower back pain. Pt states temp of 99-101 at home.

## 2023-09-03 ENCOUNTER — Ambulatory Visit: Payer: 59

## 2023-09-08 ENCOUNTER — Ambulatory Visit: Payer: 59 | Attending: Nurse Practitioner

## 2023-11-10 ENCOUNTER — Ambulatory Visit: Admitting: Nurse Practitioner

## 2023-11-18 ENCOUNTER — Encounter: Payer: Self-pay | Admitting: Nurse Practitioner

## 2023-11-18 ENCOUNTER — Ambulatory Visit: Admitting: Nurse Practitioner

## 2023-11-18 VITALS — BP 148/102 | HR 76 | Ht 65.0 in | Wt 233.0 lb

## 2023-11-18 DIAGNOSIS — I10 Essential (primary) hypertension: Secondary | ICD-10-CM | POA: Diagnosis not present

## 2023-11-18 MED ORDER — OLMESARTAN MEDOXOMIL 20 MG PO TABS: 20.0000 mg | ORAL_TABLET | Freq: Every day | ORAL | 0 refills | Status: DC

## 2023-11-18 NOTE — Assessment & Plan Note (Signed)
 Chronic. Not well controlled. Will start Olmesartan 20mg  daily.  Side effects and benefits of medication discussed during visit.  Follow up in 1 month.  Will check labs at next visit.

## 2023-11-18 NOTE — Progress Notes (Signed)
 BP (!) 148/102 (BP Location: Left Arm, Patient Position: Sitting, Cuff Size: Large)   Pulse 76   Ht 5\' 5"  (1.651 m)   Wt 233 lb (105.7 kg)   LMP 11/06/2023 (Exact Date)   SpO2 98%   BMI 38.77 kg/m    Subjective:    Patient ID: Donna Odonnell, female    DOB: [birth year], 48 y.o.   MRN: [id number]  HPI: Donna Odonnell is a 49 y.o. female  Chief Complaint  Patient presents with   Menstrual Problem    Pt complains of periods being a lot more heavier. Noticed changes in December.   Patient states in December her cycle came on two weeks apart.  States now she feels like it is heavier since December.  She is having more cramping with her cycle.     HYPERTENSION without Chronic Kidney Disease Hypertension status: uncontrolled  Satisfied with current treatment? yes Duration of hypertension: years BP monitoring frequency:  not checking BP range:  BP medication side effects:  no Medication compliance: poor compliance Previous BP meds:olmesartan (benicar) Aspirin: no Recurrent headaches: no Visual changes: no Palpitations: no Dyspnea: no Chest pain: no Lower extremity edema: no Dizzy/lightheaded: no     Relevant past medical, surgical, family and social history reviewed and updated as indicated. Interim medical history since our last visit reviewed. Allergies and medications reviewed and updated.  Review of Systems  Eyes:  Negative for visual disturbance.  Respiratory:  Negative for cough, chest tightness and shortness of breath.   Cardiovascular:  Negative for chest pain, palpitations and leg swelling.  Neurological:  Negative for dizziness and headaches.    Per HPI unless specifically indicated above     Objective:    BP (!) 148/102 (BP Location: Left Arm, Patient Position: Sitting, Cuff Size: Large)   Pulse 76   Ht 5\' 5"  (1.651 m)   Wt 233 lb (105.7 kg)   LMP 11/06/2023 (Exact Date)   SpO2 98%   BMI 38.77 kg/m   Wt Readings from Last 3 Encounters:   11/18/23 233 lb (105.7 kg)  08/27/23 233 lb (105.7 kg)  08/25/23 233 lb 12.8 oz (106.1 kg)    Physical Exam Vitals and nursing note reviewed.  Constitutional:      General: She is not in acute distress.    Appearance: Normal appearance. She is obese. She is not ill-appearing, toxic-appearing or diaphoretic.  HENT:     Head: Normocephalic.     Right Ear: External ear normal.     Left Ear: External ear normal.     Nose: Nose normal.     Mouth/Throat:     Mouth: Mucous membranes are moist.     Pharynx: Oropharynx is clear.  Eyes:     General:        Right eye: No discharge.        Left eye: No discharge.     Extraocular Movements: Extraocular movements intact.     Conjunctiva/sclera: Conjunctivae normal.     Pupils: Pupils are equal, round, and reactive to light.  Cardiovascular:     Rate and Rhythm: Normal rate and regular rhythm.     Heart sounds: No murmur heard. Pulmonary:     Effort: Pulmonary effort is normal. No respiratory distress.     Breath sounds: Normal breath sounds. No wheezing or rales.  Musculoskeletal:     Cervical back: Normal range of motion and neck supple.  Skin:    General: Skin is warm and  dry.     Capillary Refill: Capillary refill takes less than 2 seconds.  Neurological:     General: No focal deficit present.     Mental Status: She is alert and oriented to person, place, and time. Mental status is at baseline.  Psychiatric:        Mood and Affect: Mood normal.        Behavior: Behavior normal.        Thought Content: Thought content normal.        Judgment: Judgment normal.     Results for orders placed or performed during the hospital encounter of 08/27/23  Resp panel by RT-PCR (RSV, Flu A&B, Covid) Anterior Nasal Swab   Collection Time: 08/27/23  7:52 AM   Specimen: Anterior Nasal Swab  Result Value Ref Range   SARS Coronavirus 2 by RT PCR NEGATIVE NEGATIVE   Influenza A by PCR NEGATIVE NEGATIVE   Influenza B by PCR POSITIVE (A)  NEGATIVE   Resp Syncytial Virus by PCR NEGATIVE NEGATIVE  Urinalysis, Routine w reflex microscopic -Urine, Clean Catch   Collection Time: 08/27/23  8:09 AM  Result Value Ref Range   Color, Urine AMBER (A) YELLOW   APPearance CLOUDY (A) CLEAR   Specific Gravity, Urine 1.030 1.005 - 1.030   pH 5.0 5.0 - 8.0   Glucose, UA NEGATIVE NEGATIVE mg/dL   Hgb urine dipstick SMALL (A) NEGATIVE   Bilirubin Urine NEGATIVE NEGATIVE   Ketones, ur NEGATIVE NEGATIVE mg/dL   Protein, ur 30 (A) NEGATIVE mg/dL   Nitrite NEGATIVE NEGATIVE   Leukocytes,Ua TRACE (A) NEGATIVE   RBC / HPF 0-5 0 - 5 RBC/hpf   WBC, UA 6-10 0 - 5 WBC/hpf   Bacteria, UA NONE SEEN NONE SEEN   Squamous Epithelial / HPF 21-50 0 - 5 /HPF   Mucus PRESENT       Assessment & Plan:   Problem List Items Addressed This Visit       Cardiovascular and Mediastinum   Primary hypertension - Primary   Chronic. Not well controlled. Will start Olmesartan 20mg  daily.  Side effects and benefits of medication discussed during visit.  Follow up in 1 month.  Will check labs at next visit.       Relevant Medications   olmesartan (BENICAR) 20 MG tablet     Follow up plan: Return in about 1 month (around 12/19/2023) for Physical and Fasting labs.

## 2023-12-03 ENCOUNTER — Encounter: Payer: Self-pay | Admitting: Nurse Practitioner

## 2023-12-04 NOTE — Telephone Encounter (Signed)
 Called patient left message for patient to call and schedule appt.

## 2023-12-22 ENCOUNTER — Encounter: Admitting: Nurse Practitioner

## 2023-12-29 ENCOUNTER — Encounter: Payer: Self-pay | Admitting: Nurse Practitioner

## 2023-12-29 MED ORDER — OLMESARTAN MEDOXOMIL 20 MG PO TABS: 20.0000 mg | ORAL_TABLET | Freq: Every day | ORAL | 0 refills | Status: DC

## 2024-01-06 ENCOUNTER — Ambulatory Visit: Admitting: Nurse Practitioner

## 2024-01-06 ENCOUNTER — Encounter: Payer: Self-pay | Admitting: Nurse Practitioner

## 2024-01-06 VITALS — BP 136/83 | HR 86 | Ht 65.0 in | Wt 236.0 lb

## 2024-01-06 DIAGNOSIS — Z23 Encounter for immunization: Secondary | ICD-10-CM

## 2024-01-06 DIAGNOSIS — Z0000 Encounter for general adult medical examination without abnormal findings: Secondary | ICD-10-CM

## 2024-01-06 DIAGNOSIS — E7800 Pure hypercholesterolemia, unspecified: Secondary | ICD-10-CM | POA: Diagnosis not present

## 2024-01-06 DIAGNOSIS — I10 Essential (primary) hypertension: Secondary | ICD-10-CM

## 2024-01-06 DIAGNOSIS — Z1211 Encounter for screening for malignant neoplasm of colon: Secondary | ICD-10-CM | POA: Diagnosis not present

## 2024-01-06 MED ORDER — OLMESARTAN MEDOXOMIL 20 MG PO TABS: 20.0000 mg | ORAL_TABLET | Freq: Every day | ORAL | 1 refills | Status: AC

## 2024-01-06 NOTE — Assessment & Plan Note (Signed)
 Chronic.  Controlled.  Continue with current medication regimen of Olmesartan 20mg  daily.  Refills sent today.  Labs ordered today.  Return to clinic in 6 months for reevaluation.  Call sooner if concerns arise.

## 2024-01-06 NOTE — Progress Notes (Signed)
 BP 136/83   Pulse 86   Ht 5' 5 (1.651 m)   Wt 236 lb (107 kg)   SpO2 99%   BMI 39.27 kg/m    Subjective:    Patient ID: Donna Odonnell, female    DOB: 04/06/1976, 48 y.o.   MRN: [id number]  HPI: Donna Odonnell is a 48 y.o. female presenting on 01/06/2024 for comprehensive medical examination. Current medical complaints include:none  She currently lives with: Menopausal Symptoms: no  HYPERTENSION without Chronic Kidney Disease Hypertension status: controlled  Satisfied with current treatment? yes Duration of hypertension: years BP monitoring frequency:  not checking BP range:  BP medication side effects:  no Medication compliance: excellent compliance Previous BP meds:olmesartan  (benicar ) Aspirin: no Recurrent headaches: no Visual changes: no Palpitations: no Dyspnea: no Chest pain: no Lower extremity edema: no Dizzy/lightheaded: no   Depression Screen done today and results listed below:     01/06/2024    9:57 AM 11/18/2023    1:18 PM 09/06/2022    9:06 AM 03/04/2022   10:11 AM 07/13/2021   11:12 AM  Depression screen PHQ 2/9  Decreased Interest 0 1 0 0 0  Down, Depressed, Hopeless 0 1 0 0 0  PHQ - 2 Score 0 2 0 0 0  Altered sleeping 0 0 0 0 0  Tired, decreased energy 0  0 0 0  Change in appetite 0 0 0 0 0  Feeling bad or failure about yourself  0 0 0 0 0  Trouble concentrating 0 0 0 0 0  Moving slowly or fidgety/restless 0 0 0 0 0  Suicidal thoughts 0 0 0 0 0  PHQ-9 Score 0 2 0 0 0  Difficult doing work/chores  Not difficult at all Not difficult at all Not difficult at all Not difficult at all    The patient does not have a history of falls. I did complete a risk assessment for falls. A plan of care for falls was documented.   Past Medical History:  Past Medical History:  Diagnosis Date   COVID-19    GERD (gastroesophageal reflux disease)    Hypertension    Osteoarthritis    Tobacco use     Surgical History:  Past Surgical History:  Procedure  Laterality Date   CESAREAN SECTION     x3   TONSILLECTOMY     TOTAL HIP ARTHROPLASTY Right 04/18/2022   Procedure: TOTAL HIP ARTHROPLASTY;  Surgeon: Edie Norleen PARAS, MD;  Location: [facility] ORS;  Service: Orthopedics;  Laterality: Right;   TUBAL LIGATION      Medications:  No current outpatient medications on file prior to visit.   No current facility-administered medications on file prior to visit.    Allergies:  Allergies  Allergen Reactions   Ciprofloxacin Hives   Other Rash    Latex gloves break out tops of hands only not palms-unsure if there is a latex allergy    Social History:  Social History   Socioeconomic History   Marital status: Single    Spouse name: Not on file   Number of children: Not on file   Years of education: Not on file   Highest education level: 12th grade  Occupational History   Not on file  Tobacco Use   Smoking status: Every Day    Current packs/day: 0.25    Average packs/day: 0.3 packs/day for 24.0 years (6.0 ttl pk-yrs)    Types: Cigarettes   Smokeless tobacco: Never  Vaping Use  Vaping status: Never Used  Substance and Sexual Activity   Alcohol use: Not Currently   Drug use: No   Sexual activity: Yes    Birth control/protection: None  Other Topics Concern   Not on file  Social History Narrative   Not on file   Social Drivers of Health   Financial Resource Strain: Low Risk  (01/05/2024)   Overall Financial Resource Strain (CARDIA)    Difficulty of Paying Living Expenses: Not very hard  Food Insecurity: Food Insecurity Present (01/05/2024)   Hunger Vital Sign    Worried About Running Out of Food in the Last Year: Sometimes true    Ran Out of Food in the Last Year: Never true  Transportation Needs: No Transportation Needs (01/05/2024)   PRAPARE - Administrator, Civil Service (Medical): No    Lack of Transportation (Non-Medical): No  Physical Activity: Insufficiently Active (01/05/2024)   Exercise Vital Sign    Days of  Exercise per Week: 3 days    Minutes of Exercise per Session: 20 min  Stress: No Stress Concern Present (01/05/2024)   [employer] of Occupational Health - Occupational Stress Questionnaire    Feeling of Stress: Not at all  Social Connections: Moderately Isolated (01/05/2024)   Social Connection and Isolation Panel    Frequency of Communication with Friends and Family: More than three times a week    Frequency of Social Gatherings with Friends and Family: Twice a week    Attends Religious Services: Never    Database administrator or Organizations: No    Attends Engineer, structural: Not on file    Marital Status: Living with partner  Intimate Partner Violence: Not At Risk (04/18/2022)   Humiliation, Afraid, Rape, and Kick questionnaire    Fear of Current or Ex-Partner: No    Emotionally Abused: No    Physically Abused: No    Sexually Abused: No   Social History   Tobacco Use  Smoking Status Every Day   Current packs/day: 0.25   Average packs/day: 0.3 packs/day for 24.0 years (6.0 ttl pk-yrs)   Types: Cigarettes  Smokeless Tobacco Never   Social History   Substance and Sexual Activity  Alcohol Use Not Currently    Family History:  Family History  Problem Relation Age of Onset   Cancer Mother    COPD Mother    Breast cancer Mother 30   Breast cancer Maternal Grandmother 52    Past medical history, surgical history, medications, allergies, family history and social history reviewed with patient today and changes made to appropriate areas of the chart.   Review of Systems  Eyes:  Negative for blurred vision and double vision.  Respiratory:  Negative for shortness of breath.   Cardiovascular:  Negative for chest pain, palpitations and leg swelling.  Neurological:  Negative for dizziness and headaches.   All other ROS negative except what is listed above and in the HPI.      Objective:    BP 136/83   Pulse 86   Ht 5' 5 (1.651 m)   Wt 236 lb (107 kg)    SpO2 99%   BMI 39.27 kg/m   Wt Readings from Last 3 Encounters:  01/06/24 236 lb (107 kg)  11/18/23 233 lb (105.7 kg)  08/27/23 233 lb (105.7 kg)    Physical Exam Vitals and nursing note reviewed.  Constitutional:      General: She is awake. She is not in acute distress.  Appearance: Normal appearance. She is well-developed. She is obese. She is not ill-appearing.  HENT:     Head: Normocephalic and atraumatic.     Right Ear: Hearing, tympanic membrane, ear canal and external ear normal. No drainage.     Left Ear: Hearing, tympanic membrane, ear canal and external ear normal. No drainage.     Nose: Nose normal.     Right Sinus: No maxillary sinus tenderness or frontal sinus tenderness.     Left Sinus: No maxillary sinus tenderness or frontal sinus tenderness.     Mouth/Throat:     Mouth: Mucous membranes are moist.     Pharynx: Oropharynx is clear. Uvula midline. No pharyngeal swelling, oropharyngeal exudate or posterior oropharyngeal erythema.   Eyes:     General: Lids are normal.        Right eye: No discharge.        Left eye: No discharge.     Extraocular Movements: Extraocular movements intact.     Conjunctiva/sclera: Conjunctivae normal.     Pupils: Pupils are equal, round, and reactive to light.     Visual Fields: Right eye visual fields normal and left eye visual fields normal.   Neck:     Thyroid : No thyromegaly.     Vascular: No carotid bruit.     Trachea: Trachea normal.   Cardiovascular:     Rate and Rhythm: Normal rate and regular rhythm.     Heart sounds: Normal heart sounds. No murmur heard.    No gallop.  Pulmonary:     Effort: Pulmonary effort is normal. No accessory muscle usage or respiratory distress.     Breath sounds: Normal breath sounds.  Chest:  Breasts:    Right: Normal.     Left: Normal.  Abdominal:     General: Bowel sounds are normal.     Palpations: Abdomen is soft. There is no hepatomegaly or splenomegaly.     Tenderness: There is  no abdominal tenderness.   Musculoskeletal:        General: Normal range of motion.     Cervical back: Normal range of motion and neck supple.     Right lower leg: No edema.     Left lower leg: No edema.  Lymphadenopathy:     Head:     Right side of head: No submental, submandibular, tonsillar, preauricular or posterior auricular adenopathy.     Left side of head: No submental, submandibular, tonsillar, preauricular or posterior auricular adenopathy.     Cervical: No cervical adenopathy.     Upper Body:     Right upper body: No supraclavicular, axillary or pectoral adenopathy.     Left upper body: No supraclavicular, axillary or pectoral adenopathy.   Skin:    General: Skin is warm and dry.     Capillary Refill: Capillary refill takes less than 2 seconds.     Findings: No rash.   Neurological:     Mental Status: She is alert and oriented to person, place, and time.     Gait: Gait is intact.   Psychiatric:        Attention and Perception: Attention normal.        Mood and Affect: Mood normal.        Speech: Speech normal.        Behavior: Behavior normal. Behavior is cooperative.        Thought Content: Thought content normal.        Judgment: Judgment normal.  Results for orders placed or performed during the hospital encounter of 08/27/23  Resp panel by RT-PCR (RSV, Flu A&B, Covid) Anterior Nasal Swab   Collection Time: 08/27/23  7:52 AM   Specimen: Anterior Nasal Swab  Result Value Ref Range   SARS Coronavirus 2 by RT PCR NEGATIVE NEGATIVE   Influenza A by PCR NEGATIVE NEGATIVE   Influenza B by PCR POSITIVE (A) NEGATIVE   Resp Syncytial Virus by PCR NEGATIVE NEGATIVE  Urinalysis, Routine w reflex microscopic -Urine, Clean Catch   Collection Time: 08/27/23  8:09 AM  Result Value Ref Range   Color, Urine AMBER (A) YELLOW   APPearance CLOUDY (A) CLEAR   Specific Gravity, Urine 1.030 1.005 - 1.030   pH 5.0 5.0 - 8.0   Glucose, UA NEGATIVE NEGATIVE mg/dL   Hgb  urine dipstick SMALL (A) NEGATIVE   Bilirubin Urine NEGATIVE NEGATIVE   Ketones, ur NEGATIVE NEGATIVE mg/dL   Protein, ur 30 (A) NEGATIVE mg/dL   Nitrite NEGATIVE NEGATIVE   Leukocytes,Ua TRACE (A) NEGATIVE   RBC / HPF 0-5 0 - 5 RBC/hpf   WBC, UA 6-10 0 - 5 WBC/hpf   Bacteria, UA NONE SEEN NONE SEEN   Squamous Epithelial / HPF 21-50 0 - 5 /HPF   Mucus PRESENT       Assessment & Plan:   Problem List Items Addressed This Visit       Cardiovascular and Mediastinum   Primary hypertension   Chronic.  Controlled.  Continue with current medication regimen of Olmesartan  20mg  daily.  Refills sent today.  Labs ordered today.  Return to clinic in 6 months for reevaluation.  Call sooner if concerns arise.        Relevant Medications   olmesartan  (BENICAR ) 20 MG tablet   Other Visit Diagnoses       Annual physical exam    -  Primary   Health maintenance reviewed during visit today.  Labs ordered.  Vaccines reviewed. PAP and Mammogram up to date.  Colonoscopy ordered.   Relevant Orders   CBC with Differential/Platelet   Comprehensive metabolic panel with GFR   Lipid panel   TSH     Elevated LDL cholesterol level       Labs ordered.  Will make recommendations based on results.   Relevant Orders   Lipid panel     Screening for colon cancer       Relevant Orders   Ambulatory referral to Gastroenterology     Need for vaccination for Strep pneumoniae       Relevant Orders   Pneumococcal conjugate vaccine 20-valent (Prevnar 20) (Completed)     Need for HPV vaccination       Relevant Orders   HPV 9-valent vaccine,Recombinat (Completed)        Follow up plan: Return in about 6 months (around 07/07/2024) for HTN, HLD, DM2 FU.   LABORATORY TESTING:  - Pap smear: up to date  IMMUNIZATIONS:   - Tdap: Tetanus vaccination status reviewed: last tetanus booster within 10 years. - Influenza: Postponed to flu season - Pneumovax: Not applicable - Prevnar: Administered today - COVID:  Not applicable - HPV: Administered today - Shingrix vaccine: Not applicable  SCREENING: -Mammogram: Up to date  - Colonoscopy: Ordered today  - Bone Density: Not applicable  -Hearing Test: Not applicable  -Spirometry: Not applicable   PATIENT COUNSELING:   Advised to take 1 mg of folate supplement per day if capable of pregnancy.   Sexuality: Discussed sexually  transmitted diseases, partner selection, use of condoms, avoidance of unintended pregnancy  and contraceptive alternatives.   Advised to avoid cigarette smoking.  I discussed with the patient that most people either abstain from alcohol or drink within safe limits (<=14/week and <=4 drinks/occasion for males, <=7/weeks and <= 3 drinks/occasion for females) and that the risk for alcohol disorders and other health effects rises proportionally with the number of drinks per week and how often a drinker exceeds daily limits.  Discussed cessation/primary prevention of drug use and availability of treatment for abuse.   Diet: Encouraged to adjust caloric intake to maintain  or achieve ideal body weight, to reduce intake of dietary saturated fat and total fat, to limit sodium intake by avoiding high sodium foods and not adding table salt, and to maintain adequate dietary potassium and calcium preferably from fresh fruits, vegetables, and low-fat dairy products.    stressed the importance of regular exercise  Injury prevention: Discussed safety belts, safety helmets, smoke detector, smoking near bedding or upholstery.   Dental health: Discussed importance of regular tooth brushing, flossing, and dental visits.    NEXT PREVENTATIVE PHYSICAL DUE IN 1 YEAR. Return in about 6 months (around 07/07/2024) for HTN, HLD, DM2 FU.

## 2024-01-07 ENCOUNTER — Ambulatory Visit: Payer: Self-pay | Admitting: Nurse Practitioner

## 2024-01-07 LAB — LIPID PANEL
Chol/HDL Ratio: 3.3 ratio (ref 0.0–4.4)
Cholesterol, Total: 180 mg/dL (ref 100–199)
HDL: 55 mg/dL (ref >39)
LDL Chol Calc (NIH): 110 mg/dL — ABNORMAL HIGH (ref 0–99)
Triglycerides: 81 mg/dL (ref 0–149)
VLDL Cholesterol Cal: 15 mg/dL (ref 5–40)

## 2024-01-07 LAB — COMPREHENSIVE METABOLIC PANEL WITH GFR
ALT: 9 IU/L (ref 0–32)
AST: 13 IU/L (ref 0–40)
Albumin: 3.9 g/dL (ref 3.9–4.9)
Alkaline Phosphatase: 54 IU/L (ref 44–121)
BUN/Creatinine Ratio: 10 (ref 9–23)
BUN: 8 mg/dL (ref 6–24)
Bilirubin Total: <0.2 mg/dL (ref 0.0–1.2)
CO2: 22 mmol/L (ref 20–29)
Calcium: 9.3 mg/dL (ref 8.7–10.2)
Chloride: 104 mmol/L (ref 96–106)
Creatinine, Ser: 0.8 mg/dL (ref 0.57–1.00)
Globulin, Total: 2.5 g/dL (ref 1.5–4.5)
Glucose: 91 mg/dL (ref 70–99)
Potassium: 4.6 mmol/L (ref 3.5–5.2)
Sodium: 141 mmol/L (ref 134–144)
Total Protein: 6.4 g/dL (ref 6.0–8.5)
eGFR: 93 mL/min/{1.73_m2} (ref >59)

## 2024-01-07 LAB — CBC WITH DIFFERENTIAL/PLATELET
Basophils Absolute: 0 10*3/uL (ref 0.0–0.2)
Basos: 1 %
EOS (ABSOLUTE): 0.1 10*3/uL (ref 0.0–0.4)
Eos: 1 %
Hematocrit: 40.5 % (ref 34.0–46.6)
Hemoglobin: 12.5 g/dL (ref 11.1–15.9)
Immature Grans (Abs): 0 10*3/uL (ref 0.0–0.1)
Immature Granulocytes: 0 %
Lymphocytes Absolute: 2.3 10*3/uL (ref 0.7–3.1)
Lymphs: 40 %
MCH: 27.8 pg (ref 26.6–33.0)
MCHC: 30.9 g/dL — ABNORMAL LOW (ref 31.5–35.7)
MCV: 90 fL (ref 79–97)
Monocytes Absolute: 0.5 10*3/uL (ref 0.1–0.9)
Monocytes: 8 %
Neutrophils Absolute: 2.9 10*3/uL (ref 1.4–7.0)
Neutrophils: 50 %
Platelets: 313 10*3/uL (ref 150–450)
RBC: 4.5 x10E6/uL (ref 3.77–5.28)
RDW: 14 % (ref 11.7–15.4)
WBC: 5.8 10*3/uL (ref 3.4–10.8)

## 2024-01-07 LAB — TSH: TSH: 0.941 u[IU]/mL (ref 0.450–4.500)

## 2024-03-02 ENCOUNTER — Encounter: Payer: Self-pay | Admitting: Nurse Practitioner

## 2024-03-08 ENCOUNTER — Ambulatory Visit (INDEPENDENT_AMBULATORY_CARE_PROVIDER_SITE_OTHER)

## 2024-03-08 DIAGNOSIS — Z23 Encounter for immunization: Secondary | ICD-10-CM | POA: Diagnosis not present

## 2024-03-08 NOTE — Progress Notes (Signed)
 Patient is in office today for a nurse visit for Immunization. Patient Injection was given in the  Left deltoid. Patient tolerated injection well.

## 2024-04-11 ENCOUNTER — Encounter: Payer: Self-pay | Admitting: Emergency Medicine

## 2024-04-11 ENCOUNTER — Emergency Department
Admission: EM | Admit: 2024-04-11 | Discharge: 2024-04-11 | Disposition: A | Discharge status: Home / Self Care | Attending: Emergency Medicine | Admitting: Emergency Medicine

## 2024-04-11 ENCOUNTER — Other Ambulatory Visit: Payer: Self-pay

## 2024-04-11 DIAGNOSIS — I10 Essential (primary) hypertension: Secondary | ICD-10-CM | POA: Insufficient documentation

## 2024-04-11 DIAGNOSIS — R519 Headache, unspecified: Secondary | ICD-10-CM | POA: Insufficient documentation

## 2024-04-11 DIAGNOSIS — Z8616 Personal history of COVID-19: Secondary | ICD-10-CM | POA: Insufficient documentation

## 2024-04-11 DIAGNOSIS — J029 Acute pharyngitis, unspecified: Secondary | ICD-10-CM | POA: Insufficient documentation

## 2024-04-11 DIAGNOSIS — Z96641 Presence of right artificial hip joint: Secondary | ICD-10-CM | POA: Diagnosis not present

## 2024-04-11 LAB — GROUP A STREP BY PCR: Group A Strep by PCR: NOT DETECTED

## 2024-04-11 LAB — RESP PANEL BY RT-PCR (RSV, FLU A&B, COVID)  RVPGX2
Influenza A by PCR: NEGATIVE
Influenza B by PCR: NEGATIVE
Resp Syncytial Virus by PCR: NEGATIVE
SARS Coronavirus 2 by RT PCR: NEGATIVE

## 2024-04-11 MED ORDER — ACETAMINOPHEN 325 MG PO TABS
650.0000 mg | ORAL_TABLET | Freq: Once | ORAL | Status: AC
  Administered 2024-04-11: 650 mg via ORAL
  Filled 2024-04-11: qty 2

## 2024-04-11 MED ORDER — KETOROLAC TROMETHAMINE 15 MG/ML IJ SOLN
15.0000 mg | Freq: Once | INTRAMUSCULAR | Status: AC
  Administered 2024-04-11: 15 mg via INTRAMUSCULAR
  Filled 2024-04-11: qty 1

## 2024-04-11 NOTE — ED Triage Notes (Signed)
 Patient to ED via POV for sore throat and headache. Ongoing x2 days. NAD noted.

## 2024-04-11 NOTE — ED Provider Notes (Signed)
 [facility] Provider Note    Event Date/Time   First MD Initiated Contact with Patient 04/11/24 1047     (approximate)   History   Sore Throat   HPI  Donna Odonnell is a 48 y.o. female who presents today for evaluation of sore throat and headache that has been ongoing for the past 2 days.  She is unsure of any sick contacts.  No fevers or chills.  She has pain with swallowing but no difficulty swallowing.  No voice change.  No nausea or vomiting.  No visual changes.  No difficulty ambulating.  No numbness, tingling, or weakness.    Patient Active Problem List   Diagnosis Date Noted   Status post total hip replacement, right 04/18/2022   Primary hypertension 09/06/2021   COVID 07/13/2021   Left shoulder pain 02/19/2021   Screening for cervical cancer 02/19/2021          Physical Exam   Triage Vital Signs: ED Triage Vitals  Encounter Vitals Group     BP 04/11/24 1043 (!) 149/104     Girls Systolic BP Percentile --      Girls Diastolic BP Percentile --      Boys Systolic BP Percentile --      Boys Diastolic BP Percentile --      Pulse Rate 04/11/24 1043 89     Resp 04/11/24 1043 17     Temp 04/11/24 1043 98.2 F (36.8 C)     Temp Source 04/11/24 1043 Oral     SpO2 04/11/24 1043 95 %     Weight 04/11/24 1042 235 lb 14.3 oz (107 kg)     Height 04/11/24 1042 5' 5 (1.651 m)     Head Circumference --      Peak Flow --      Pain Score 04/11/24 1042 7     Pain Loc --      Pain Education --      Exclude from Growth Chart --     Most recent vital signs: Vitals:   04/11/24 1043  BP: (!) 149/104  Pulse: 89  Resp: 17  Temp: 98.2 F (36.8 C)  SpO2: 95%    Physical Exam Vitals and nursing note reviewed.  Constitutional:      General: Awake and alert. No acute distress.    Appearance: Normal appearance. The patient is normal weight.  HENT:     Head: Normocephalic and atraumatic.     Mouth: Mucous membranes are moist. Uvula midline.   No tonsillar exudate.  No soft palate fluctuance.  No trismus.  No voice change.  No sublingual swelling.  No tender cervical lymphadenopathy.  No nuchal rigidity Eyes:     General: PERRL. Normal EOMs        Right eye: No discharge.        Left eye: No discharge.     Conjunctiva/sclera: Conjunctivae normal.  Cardiovascular:     Rate and Rhythm: Normal rate and regular rhythm.     Pulses: Normal pulses.  Pulmonary:     Effort: Pulmonary effort is normal. No respiratory distress.     Breath sounds: Normal breath sounds.  Abdominal:     Abdomen is soft. There is no abdominal tenderness. No rebound or guarding. No distention. Musculoskeletal:        General: No swelling. Normal range of motion.     Cervical back: Normal range of motion and neck supple.  Skin:    General:  Skin is warm and dry.     Capillary Refill: Capillary refill takes less than 2 seconds.     Findings: No rash.  Neurological:     Mental Status: The patient is awake and alert.   Neurological: GCS 15 alert and oriented x3 Normal speech, no expressive or receptive aphasia or dysarthria Cranial nerves II through XII intact Normal visual fields 5 out of 5 strength in all 4 extremities with intact sensation throughout No extremity drift Normal finger-to-nose testing, no limb or truncal ataxia    ED Results / Procedures / Treatments   Labs (all labs ordered are listed, but only abnormal results are displayed) Labs Reviewed  GROUP A STREP BY PCR  RESP PANEL BY RT-PCR (RSV, FLU A&B, COVID)  RVPGX2     EKG     RADIOLOGY     PROCEDURES:  Critical Care performed:   Procedures   MEDICATIONS ORDERED IN ED: Medications  ketorolac  (TORADOL ) 15 MG/ML injection 15 mg (15 mg Intramuscular Given 04/11/24 1104)  acetaminophen  (TYLENOL ) tablet 650 mg (650 mg Oral Given 04/11/24 1104)     IMPRESSION / MDM / ASSESSMENT AND PLAN / ED COURSE  I reviewed the triage vital signs and the nursing  notes.   Differential diagnosis includes, but is not limited to, viral pharyngitis, COVID-19, strep pharyngitis, other URI.  Patient is awake and alert, hemodynamically stable and afebrile.  She is nontoxic in appearance.  She has mild oropharyngeal erythema, the uvula is midline, no tonsillar exudate, no voice change, no trismus, no neck pain or stiffness, I do not suspect peritonsillar or retropharyngeal abscess.  COVID/flu/RSV and strep swabs obtained are negative.  She was treated with Toradol  and Tylenol  with complete resolution of her headache.  Headache was gradual in onset, without history or physical exam findings to suggest encephalopathy; no altered mental status, fever or meningismus, vision changes, vomiting or focal neurological deficit and improved with treatment in the emergency department. Therefore, I have low suspicion for concerning process that would require urgent or emergent imaging or diagnostic/therapeutic procedural intervention such as lumbar puncture. Doubt meningitis as there is no fever, photophobia, neck symptoms, altered mental status. Additionally the patient is not known to be immunocompromised. No history of trauma, doubt subdural or epidural hematoma. No dizziness or other neurologic symptoms so cerebellar infarction or other hemorrhagic stroke are unlikely. Intracranial mass unlikely given that the headache is not getting progressively worse, is not worse in the morning, there are no other neurologic symptoms, and the neurologic exam is grossly normal. Unlikely to be giant cell arteritis as there is no tenderness over temporal artery or vision changes. Doubt CO toxicity as no known exposure and no other family members have a headache. No neck pain and was not sudden onset or associated with movement of the neck and no dizziness,  doubt carotid artery dissection. No occipital tenderness so occipital neuralgia seems less likely. Return precautions discussed, patient to  follow-up closely with outpatient provider.  Patient understands and agrees with plan.  She was discharged in stable condition.   Patient's presentation is most consistent with acute complicated illness / injury requiring diagnostic workup.   Clinical Course as of 04/11/24 1257  Sun Apr 11, 2024  1153 Patient reports that she feels significantly improved and ready for discharge [JP]    Clinical Course User Index [JP] Ravenne Wayment E, PA-C     FINAL CLINICAL IMPRESSION(S) / ED DIAGNOSES   Final diagnoses:  Viral pharyngitis  Rx / DC Orders   ED Discharge Orders     None        Note:  This document was prepared using Dragon voice recognition software and may include unintentional dictation errors.   Eliel Dudding E, PA-C 04/11/24 1257    Suzanne Kirsch, MD 04/11/24 1536

## 2024-04-11 NOTE — Discharge Instructions (Addendum)
 Please follow-up with your PCP.  Please return for any new, worsening, or change in symptoms or other concerns.  It was a pleasure caring for you today.

## 2024-04-11 NOTE — ED Notes (Signed)
 See triage note  Presents with sore throat and headache  States sxs' started 2 days ago Afebrile on arrival

## 2024-06-27 ENCOUNTER — Encounter: Payer: Self-pay | Admitting: Nurse Practitioner

## 2024-06-27 ENCOUNTER — Emergency Department
Admission: EM | Admit: 2024-06-27 | Discharge: 2024-06-27 | Disposition: A | Discharge status: Home / Self Care | Attending: Emergency Medicine | Admitting: Emergency Medicine

## 2024-06-27 ENCOUNTER — Other Ambulatory Visit: Payer: Self-pay

## 2024-06-27 DIAGNOSIS — I10 Essential (primary) hypertension: Secondary | ICD-10-CM | POA: Insufficient documentation

## 2024-06-27 DIAGNOSIS — B349 Viral infection, unspecified: Secondary | ICD-10-CM | POA: Insufficient documentation

## 2024-06-27 DIAGNOSIS — R030 Elevated blood-pressure reading, without diagnosis of hypertension: Secondary | ICD-10-CM

## 2024-06-27 LAB — COMPREHENSIVE METABOLIC PANEL WITH GFR
ALT: 6 U/L (ref 0–44)
AST: 14 U/L — ABNORMAL LOW (ref 15–41)
Albumin: 3.8 g/dL (ref 3.5–5.0)
Alkaline Phosphatase: 49 U/L (ref 38–126)
Anion gap: 9 (ref 5–15)
BUN: 10 mg/dL (ref 6–20)
CO2: 23 mmol/L (ref 22–32)
Calcium: 8.8 mg/dL — ABNORMAL LOW (ref 8.9–10.3)
Chloride: 109 mmol/L (ref 98–111)
Creatinine, Ser: 0.75 mg/dL (ref 0.44–1.00)
GFR, Estimated: >60 mL/min (ref >60)
Glucose, Bld: 96 mg/dL (ref 70–99)
Potassium: 3.8 mmol/L (ref 3.5–5.1)
Sodium: 141 mmol/L (ref 135–145)
Total Bilirubin: 0.3 mg/dL (ref 0.0–1.2)
Total Protein: 6.7 g/dL (ref 6.5–8.1)

## 2024-06-27 LAB — URINALYSIS, ROUTINE W REFLEX MICROSCOPIC
Bacteria, UA: NONE SEEN
Bilirubin Urine: NEGATIVE
Glucose, UA: NEGATIVE mg/dL
Ketones, ur: NEGATIVE mg/dL
Nitrite: NEGATIVE
Protein, ur: NEGATIVE mg/dL
Specific Gravity, Urine: 1.014 (ref 1.005–1.030)
pH: 6 (ref 5.0–8.0)

## 2024-06-27 LAB — CBC
HCT: 39.4 % (ref 36.0–46.0)
Hemoglobin: 12.4 g/dL (ref 12.0–15.0)
MCH: 27.3 pg (ref 26.0–34.0)
MCHC: 31.5 g/dL (ref 30.0–36.0)
MCV: 86.8 fL (ref 80.0–100.0)
Platelets: 284 K/uL (ref 150–400)
RBC: 4.54 MIL/uL (ref 3.87–5.11)
RDW: 15.2 % (ref 11.5–15.5)
WBC: 6.5 K/uL (ref 4.0–10.5)
nRBC: 0 % (ref 0.0–0.2)

## 2024-06-27 LAB — RESP PANEL BY RT-PCR (RSV, FLU A&B, COVID)  RVPGX2
Influenza A by PCR: NEGATIVE
Influenza B by PCR: NEGATIVE
Resp Syncytial Virus by PCR: NEGATIVE
SARS Coronavirus 2 by RT PCR: NEGATIVE

## 2024-06-27 LAB — LIPASE, BLOOD: Lipase: 17 U/L (ref 11–51)

## 2024-06-27 LAB — POC URINE PREG, ED: Preg Test, Ur: NEGATIVE

## 2024-06-27 MED ORDER — DICYCLOMINE HCL 10 MG PO CAPS
10.0000 mg | ORAL_CAPSULE | Freq: Once | ORAL | Status: AC
  Administered 2024-06-27: 10 mg via ORAL
  Filled 2024-06-27: qty 1

## 2024-06-27 MED ORDER — FAMOTIDINE 20 MG PO TABS
20.0000 mg | ORAL_TABLET | Freq: Once | ORAL | Status: AC
  Administered 2024-06-27: 20 mg via ORAL
  Filled 2024-06-27: qty 1

## 2024-06-27 NOTE — ED Notes (Signed)
 States she feels a little better  But still has that pain with drinking liquids

## 2024-06-27 NOTE — Discharge Instructions (Signed)
 Drink plenty of fluids over the next few days.  You may take Tylenol  or ibuprofen  as needed for body aches, pain, or fever.  Follow-up with your primary care provider.  Return to the ER for new, worsening, or persistent severe weakness, diarrhea, abdominal pain, vomiting, fever, or any other new or worsening symptoms that concern you.

## 2024-06-27 NOTE — ED Triage Notes (Signed)
 Pt to ED for  high blood pressure since yesterday, takes BP meds. States had HA yesterday AM when she woke up, no HA now. BP was 136/90 yesterday and 163/93 today. Also having upper abdominal discomfort since yesterday (sharp) and had 3 episodes of diarrhea this AM. Hx GERD.

## 2024-06-27 NOTE — ED Notes (Signed)
 See triage note  Presents with some abd discomfort after drinking fluids which started yesterday   She also had a headache yesterday  Denies any fever  But has had some diarrhea

## 2024-06-27 NOTE — ED Provider Notes (Signed)
 [facility] Provider Note    Event Date/Time   First MD Initiated Contact with Patient 06/27/24 1120     (approximate)   History   Hypertension and Abdominal Pain   HPI  Donna Odonnell is a 48 y.o. female with a history of hypertension who presents with diarrhea since this morning associated with some epigastric crampy pain.  The patient also has a headache and some malaise and generalized weakness.  She denies any vomiting.  She has had an elevated blood pressure since yesterday.  She is compliant with her olmesartan .  I reviewed the past medical records.  The patient's most recent outpatient encounter was with family medicine for follow-up of her hypertension on 6/24.  She was subsequently seen in the ED on 9/28 with a sore throat.   Physical Exam   Triage Vital Signs: ED Triage Vitals  Encounter Vitals Group     BP 06/27/24 1036 (!) 145/100     Girls Systolic BP Percentile --      Girls Diastolic BP Percentile --      Boys Systolic BP Percentile --      Boys Diastolic BP Percentile --      Pulse Rate 06/27/24 1036 91     Resp 06/27/24 1036 20     Temp 06/27/24 1036 99.2 F (37.3 C)     Temp Source 06/27/24 1036 Oral     SpO2 06/27/24 1036 100 %     Weight 06/27/24 1035 225 lb (102.1 kg)     Height 06/27/24 1035 5' 5 (1.651 m)     Head Circumference --      Peak Flow --      Pain Score 06/27/24 1033 5     Pain Loc --      Pain Education --      Exclude from Growth Chart --     Most recent vital signs: Vitals:   06/27/24 1036  BP: (!) 145/100  Pulse: 91  Resp: 20  Temp: 99.2 F (37.3 C)  SpO2: 100%     General: Alert, well-appearing, no distress.  CV:  Good peripheral perfusion.  Resp:  Normal effort.  Lungs CTAB. Abd:  Soft with mild epigastric discomfort.  No focal tenderness.  No distention.  Other:  No jaundice or scleral icterus.  Moist mucous membranes.   ED Results / Procedures / Treatments   Labs (all labs ordered  are listed, but only abnormal results are displayed) Labs Reviewed  COMPREHENSIVE METABOLIC PANEL WITH GFR - Abnormal; Notable for the following components:      Result Value   Calcium 8.8 (*)    AST 14 (*)    All other components within normal limits  URINALYSIS, ROUTINE W REFLEX MICROSCOPIC - Abnormal; Notable for the following components:   Color, Urine YELLOW (*)    APPearance CLEAR (*)    Hgb urine dipstick MODERATE (*)    Leukocytes,Ua TRACE (*)    All other components within normal limits  RESP PANEL BY RT-PCR (RSV, FLU A&B, COVID)  RVPGX2  LIPASE, BLOOD  CBC  POC URINE PREG, ED     EKG   RADIOLOGY   PROCEDURES:  Critical Care performed: No  Procedures   MEDICATIONS ORDERED IN ED: Medications  famotidine  (PEPCID ) tablet 20 mg (20 mg Oral Given 06/27/24 1141)  dicyclomine  (BENTYL ) capsule 10 mg (10 mg Oral Given 06/27/24 1141)     IMPRESSION / MDM / ASSESSMENT AND PLAN / ED  COURSE  I reviewed the triage vital signs and the nursing notes.  48 year old female with PMH as noted above presents with diarrhea, epigastric abdominal discomfort, malaise, as well as elevated blood pressure.  On exam she is overall well-appearing.  She has a borderline elevated temperature blood pressure but otherwise normal vital signs.  Abdomen does not demonstrate any focal tenderness.  Differential diagnosis includes, but is not limited to, viral gastroenteritis, other viral syndrome, flu or COVID, dehydration, electrolyte abnormality.  We will obtain basic labs, urinalysis, respiratory panel, and reassess.  Patient's presentation is most consistent with acute complicated illness / injury requiring diagnostic workup.  ----------------------------------------- 1:25 PM on 06/27/2024 -----------------------------------------  CMP and CBC show no acute findings.  Lipase is normal.  Urinalysis is negative.  Respiratory panel was also negative.  At this time, the patient is stable for  discharge home.  I counseled her on the results of the workup.  I gave strict return precautions and she expressed understanding.   FINAL CLINICAL IMPRESSION(S) / ED DIAGNOSES   Final diagnoses:  Viral syndrome  Elevated blood pressure reading     Rx / DC Orders   ED Discharge Orders     None        Note:  This document was prepared using Dragon voice recognition software and may include unintentional dictation errors.    Jacolyn Pae, MD 06/27/24 1326

## 2024-07-09 ENCOUNTER — Ambulatory Visit: Admitting: Nurse Practitioner

## 2024-07-12 ENCOUNTER — Ambulatory Visit: Admitting: Nurse Practitioner

## 2024-07-12 NOTE — Progress Notes (Deleted)
 "  LMP 06/20/2024 (Approximate)    Subjective:    Patient ID: Donna Odonnell, female    DOB: 1976-02-06, 48 y.o.   MRN: [id number]  HPI: Donna Odonnell is a 48 y.o. female  No chief complaint on file.  HYPERTENSION {Blank single:19197::without,with} Chronic Kidney Disease Hypertension status: {Blank single:19197::controlled,uncontrolled,better,worse,exacerbated,stable}  Satisfied with current treatment? {Blank single:19197::yes,no} Duration of hypertension: {Blank single:19197::chronic,months,years} BP monitoring frequency:  {Blank single:19197::not checking,rarely,daily,weekly,monthly,a few times a day,a few times a week,a few times a month} BP range:  BP medication side effects:  {Blank single:19197::yes,no} Medication compliance: {Blank single:19197::excellent compliance,good compliance,fair compliance,poor compliance} Previous BP meds:{Blank multiple:19196::none,amlodipine,amlodipine/benazepril,atenolol,benazepril,benazepril/HCTZ,bisoprolol (bystolic),carvedilol,chlorthalidone,clonidine,diltiazem,exforge HCT,HCTZ,irbesartan (avapro),labetalol,lisinopril,lisinopril-HCTZ,losartan (cozaar),methyldopa,nifedipine,olmesartan  (benicar ),olmesartan -HCTZ,quinapril,ramipril,spironalactone,tekturna,valsartan,valsartan-HCTZ,verapamil} Aspirin: {Blank single:19197::yes,no} Recurrent headaches: {Blank single:19197::yes,no} Visual changes: {Blank single:19197::yes,no} Palpitations: {Blank single:19197::yes,no} Dyspnea: {Blank single:19197::yes,no} Chest pain: {Blank single:19197::yes,no} Lower extremity edema: {Blank single:19197::yes,no} Dizzy/lightheaded: {Blank single:19197::yes,no}  Relevant past medical, surgical, family and social history reviewed and updated as indicated. Interim medical history since our last visit reviewed. Allergies and medications  reviewed and updated.  Review of Systems  Per HPI unless specifically indicated above     Objective:    LMP 06/20/2024 (Approximate)   Wt Readings from Last 3 Encounters:  06/27/24 225 lb (102.1 kg)  04/11/24 235 lb 14.3 oz (107 kg)  01/06/24 236 lb (107 kg)    Physical Exam  Results for orders placed or performed during the hospital encounter of 06/27/24  Lipase, blood   Collection Time: 06/27/24 10:39 AM  Result Value Ref Range   Lipase 17 11 - 51 U/L  Comprehensive metabolic panel   Collection Time: 06/27/24 10:39 AM  Result Value Ref Range   Sodium 141 135 - 145 mmol/L   Potassium 3.8 3.5 - 5.1 mmol/L   Chloride 109 98 - 111 mmol/L   CO2 23 22 - 32 mmol/L   Glucose, Bld 96 70 - 99 mg/dL   BUN 10 6 - 20 mg/dL   Creatinine, Ser 9.24 0.44 - 1.00 mg/dL   Calcium 8.8 (L) 8.9 - 10.3 mg/dL   Total Protein 6.7 6.5 - 8.1 g/dL   Albumin 3.8 3.5 - 5.0 g/dL   AST 14 (L) 15 - 41 U/L   ALT 6 0 - 44 U/L   Alkaline Phosphatase 49 38 - 126 U/L   Total Bilirubin 0.3 0.0 - 1.2 mg/dL   GFR, Estimated >39 >39 mL/min   Anion gap 9 5 - 15  CBC   Collection Time: 06/27/24 10:39 AM  Result Value Ref Range   WBC 6.5 4.0 - 10.5 K/uL   RBC 4.54 3.87 - 5.11 MIL/uL   Hemoglobin 12.4 12.0 - 15.0 g/dL   HCT 60.5 63.9 - 53.9 %   MCV 86.8 80.0 - 100.0 fL   MCH 27.3 26.0 - 34.0 pg   MCHC 31.5 30.0 - 36.0 g/dL   RDW 84.7 88.4 - 84.4 %   Platelets 284 150 - 400 K/uL   nRBC 0.0 0.0 - 0.2 %  Resp panel by RT-PCR (RSV, Flu A&B, Covid) Anterior Nasal Swab   Collection Time: 06/27/24 11:40 AM   Specimen: Anterior Nasal Swab  Result Value Ref Range   SARS Coronavirus 2 by RT PCR NEGATIVE NEGATIVE   Influenza A by PCR NEGATIVE NEGATIVE   Influenza B by PCR NEGATIVE NEGATIVE   Resp Syncytial Virus by PCR NEGATIVE NEGATIVE  Urinalysis, Routine w reflex microscopic -Urine, Clean Catch   Collection Time: 06/27/24 11:40 AM  Result Value Ref Range   Color, Urine YELLOW (A) YELLOW  APPearance  CLEAR (A) CLEAR   Specific Gravity, Urine 1.014 1.005 - 1.030   pH 6.0 5.0 - 8.0   Glucose, UA NEGATIVE NEGATIVE mg/dL   Hgb urine dipstick MODERATE (A) NEGATIVE   Bilirubin Urine NEGATIVE NEGATIVE   Ketones, ur NEGATIVE NEGATIVE mg/dL   Protein, ur NEGATIVE NEGATIVE mg/dL   Nitrite NEGATIVE NEGATIVE   Leukocytes,Ua TRACE (A) NEGATIVE   RBC / HPF 0-5 0 - 5 RBC/hpf   WBC, UA 0-5 0 - 5 WBC/hpf   Bacteria, UA NONE SEEN NONE SEEN   Squamous Epithelial / HPF 0-5 0 - 5 /HPF   Mucus PRESENT   POC urine preg, ED   Collection Time: 06/27/24 12:34 PM  Result Value Ref Range   Preg Test, Ur NEGATIVE NEGATIVE      Assessment & Plan:   Problem List Items Addressed This Visit       Cardiovascular and Mediastinum   Primary hypertension - Primary     Follow up plan: No follow-ups on file.      "

## 2024-08-02 ENCOUNTER — Ambulatory Visit: Admitting: Nurse Practitioner

## 2024-08-11 ENCOUNTER — Ambulatory Visit: Admitting: Nurse Practitioner

## 2024-08-16 ENCOUNTER — Other Ambulatory Visit: Payer: Self-pay | Admitting: Nurse Practitioner

## 2024-08-16 DIAGNOSIS — Z1231 Encounter for screening mammogram for malignant neoplasm of breast: Secondary | ICD-10-CM

## 2024-08-26 ENCOUNTER — Ambulatory Visit: Admitting: Nurse Practitioner

## 2024-09-06 ENCOUNTER — Encounter
